# Patient Record
Sex: Female | Born: 1935 | Hispanic: No
Health system: Northeastern US, Academic
[De-identification: ages and names within clinical notes are randomized; demographics above are authoritative.]

## PROBLEM LIST (undated history)

## (undated) DIAGNOSIS — T7840XA Allergy, unspecified, initial encounter: Secondary | ICD-10-CM

## (undated) DIAGNOSIS — E785 Hyperlipidemia, unspecified: Secondary | ICD-10-CM

## (undated) DIAGNOSIS — I2510 Atherosclerotic heart disease of native coronary artery without angina pectoris: Secondary | ICD-10-CM

## (undated) DIAGNOSIS — I471 Supraventricular tachycardia, unspecified: Secondary | ICD-10-CM

## (undated) DIAGNOSIS — D303 Benign neoplasm of bladder: Secondary | ICD-10-CM

## (undated) DIAGNOSIS — M48061 Spinal stenosis, lumbar region without neurogenic claudication: Secondary | ICD-10-CM

## (undated) DIAGNOSIS — M1990 Unspecified osteoarthritis, unspecified site: Secondary | ICD-10-CM

## (undated) DIAGNOSIS — M810 Age-related osteoporosis without current pathological fracture: Secondary | ICD-10-CM

## (undated) DIAGNOSIS — I10 Essential (primary) hypertension: Secondary | ICD-10-CM

## (undated) DIAGNOSIS — K219 Gastro-esophageal reflux disease without esophagitis: Secondary | ICD-10-CM

## (undated) DIAGNOSIS — Z8619 Personal history of other infectious and parasitic diseases: Secondary | ICD-10-CM

## (undated) DIAGNOSIS — K5792 Diverticulitis of intestine, part unspecified, without perforation or abscess without bleeding: Secondary | ICD-10-CM

## (undated) HISTORY — DX: Diverticulitis of intestine, part unspecified, without perforation or abscess without bleeding: K57.92

## (undated) HISTORY — DX: Personal history of other infectious and parasitic diseases: Z86.19

## (undated) HISTORY — DX: Unspecified osteoarthritis, unspecified site: M19.90

## (undated) HISTORY — DX: Essential (primary) hypertension: I10

## (undated) HISTORY — DX: Spinal stenosis, lumbar region without neurogenic claudication: M48.061

## (undated) HISTORY — PX: CYSTECTOMY: SUR359

## (undated) HISTORY — DX: Supraventricular tachycardia, unspecified: I47.10

## (undated) HISTORY — DX: Supraventricular tachycardia: I47.1

## (undated) HISTORY — DX: Benign neoplasm of bladder: D30.3

## (undated) HISTORY — DX: Gastro-esophageal reflux disease without esophagitis: K21.9

## (undated) HISTORY — DX: Atherosclerotic heart disease of native coronary artery without angina pectoris: I25.10

## (undated) HISTORY — PX: LIPOMA EXCISION: SHX5283

## (undated) HISTORY — DX: Age-related osteoporosis without current pathological fracture: M81.0

## (undated) HISTORY — PX: PELVIC LAPAROSCOPY: SHX162

## (undated) HISTORY — DX: Allergy, unspecified, initial encounter: T78.40XA

## (undated) HISTORY — DX: Hyperlipidemia, unspecified: E78.5

---

## 1998-10-12 ENCOUNTER — Emergency Department (HOSPITAL_COMMUNITY): Admission: EM | Admit: 1998-10-12 | Discharge: 1998-10-12 | Payer: Self-pay | Admitting: Emergency Medicine

## 1998-10-13 ENCOUNTER — Emergency Department (HOSPITAL_COMMUNITY): Admission: EM | Admit: 1998-10-13 | Discharge: 1998-10-13 | Payer: Self-pay | Admitting: Emergency Medicine

## 1998-10-13 ENCOUNTER — Encounter: Payer: Self-pay | Admitting: Emergency Medicine

## 2002-11-24 ENCOUNTER — Encounter: Admission: RE | Admit: 2002-11-24 | Discharge: 2002-11-24 | Payer: Self-pay | Admitting: Obstetrics and Gynecology

## 2002-11-24 ENCOUNTER — Encounter: Payer: Self-pay | Admitting: Obstetrics and Gynecology

## 2002-12-17 ENCOUNTER — Encounter: Admission: RE | Admit: 2002-12-17 | Discharge: 2002-12-17 | Payer: Self-pay

## 2003-10-14 DIAGNOSIS — D303 Benign neoplasm of bladder: Secondary | ICD-10-CM

## 2003-10-14 HISTORY — DX: Benign neoplasm of bladder: D30.3

## 2004-12-06 ENCOUNTER — Encounter: Admission: RE | Admit: 2004-12-06 | Discharge: 2004-12-06 | Payer: Self-pay | Admitting: Obstetrics and Gynecology

## 2004-12-11 ENCOUNTER — Encounter: Admission: RE | Admit: 2004-12-11 | Discharge: 2004-12-11 | Payer: Self-pay | Admitting: Cardiology

## 2009-09-15 ENCOUNTER — Encounter: Admission: RE | Admit: 2009-09-15 | Discharge: 2009-09-15 | Payer: Self-pay | Admitting: Internal Medicine

## 2010-11-03 ENCOUNTER — Encounter: Payer: Self-pay | Admitting: Obstetrics and Gynecology

## 2011-08-05 ENCOUNTER — Ambulatory Visit: Payer: Self-pay | Admitting: Internal Medicine

## 2011-08-12 ENCOUNTER — Encounter: Payer: Self-pay | Admitting: Family Medicine

## 2011-08-19 ENCOUNTER — Encounter: Payer: Self-pay | Admitting: Family Medicine

## 2011-10-06 ENCOUNTER — Encounter: Payer: Self-pay | Admitting: Family Medicine

## 2011-12-01 ENCOUNTER — Encounter: Payer: Self-pay | Admitting: Family Medicine

## 2012-09-06 ENCOUNTER — Ambulatory Visit: Payer: Self-pay | Admitting: Gynecology

## 2012-09-13 ENCOUNTER — Ambulatory Visit (INDEPENDENT_AMBULATORY_CARE_PROVIDER_SITE_OTHER): Payer: Medicare Other | Admitting: Internal Medicine

## 2012-09-13 ENCOUNTER — Encounter: Payer: Self-pay | Admitting: Internal Medicine

## 2012-09-13 VITALS — BP 118/80 | HR 76 | Temp 98.0°F | Ht 61.0 in | Wt 136.0 lb

## 2012-09-13 DIAGNOSIS — M79606 Pain in leg, unspecified: Secondary | ICD-10-CM | POA: Insufficient documentation

## 2012-09-13 DIAGNOSIS — M810 Age-related osteoporosis without current pathological fracture: Secondary | ICD-10-CM | POA: Insufficient documentation

## 2012-09-13 DIAGNOSIS — I2510 Atherosclerotic heart disease of native coronary artery without angina pectoris: Secondary | ICD-10-CM

## 2012-09-13 DIAGNOSIS — K219 Gastro-esophageal reflux disease without esophagitis: Secondary | ICD-10-CM | POA: Insufficient documentation

## 2012-09-13 DIAGNOSIS — G629 Polyneuropathy, unspecified: Secondary | ICD-10-CM

## 2012-09-13 DIAGNOSIS — R7309 Other abnormal glucose: Secondary | ICD-10-CM

## 2012-09-13 DIAGNOSIS — R531 Weakness: Secondary | ICD-10-CM

## 2012-09-13 DIAGNOSIS — M79609 Pain in unspecified limb: Secondary | ICD-10-CM

## 2012-09-13 DIAGNOSIS — R5381 Other malaise: Secondary | ICD-10-CM

## 2012-09-13 DIAGNOSIS — M48061 Spinal stenosis, lumbar region without neurogenic claudication: Secondary | ICD-10-CM

## 2012-09-13 DIAGNOSIS — E785 Hyperlipidemia, unspecified: Secondary | ICD-10-CM

## 2012-09-13 DIAGNOSIS — M791 Myalgia, unspecified site: Secondary | ICD-10-CM

## 2012-09-13 DIAGNOSIS — G609 Hereditary and idiopathic neuropathy, unspecified: Secondary | ICD-10-CM

## 2012-09-13 DIAGNOSIS — IMO0001 Reserved for inherently not codable concepts without codable children: Secondary | ICD-10-CM

## 2012-09-13 LAB — VITAMIN B12: Vitamin B-12: >1500 pg/mL — ABNORMAL HIGH (ref 211–911)

## 2012-09-13 LAB — CBC WITH DIFFERENTIAL/PLATELET
Basophils Relative: 0.3 % (ref 0.0–3.0)
Eosinophils Relative: 2.1 % (ref 0.0–5.0)
HCT: 42.2 % (ref 36.0–46.0)
Hemoglobin: 13.9 g/dL (ref 12.0–15.0)
Lymphs Abs: 1.8 10*3/uL (ref 0.7–4.0)
MCV: 89.7 fl (ref 78.0–100.0)
Monocytes Relative: 9.1 % (ref 3.0–12.0)
Neutro Abs: 4.8 10*3/uL (ref 1.4–7.7)
WBC: 7.5 10*3/uL (ref 4.5–10.5)

## 2012-09-13 LAB — BASIC METABOLIC PANEL
Chloride: 102 mEq/L (ref 96–112)
GFR: 59.45 mL/min — ABNORMAL LOW (ref >60.00)
Potassium: 3.6 mEq/L (ref 3.5–5.1)
Sodium: 137 mEq/L (ref 135–145)

## 2012-09-13 LAB — HEPATIC FUNCTION PANEL
ALT: 25 U/L (ref 0–35)
AST: 27 U/L (ref 0–37)
Albumin: 4 g/dL (ref 3.5–5.2)
Total Bilirubin: 0.6 mg/dL (ref 0.3–1.2)
Total Protein: 7.1 g/dL (ref 6.0–8.3)

## 2012-09-13 LAB — LIPID PANEL
Cholesterol: 191 mg/dL (ref 0–200)
LDL Cholesterol: 96 mg/dL (ref 0–99)

## 2012-09-13 NOTE — Progress Notes (Signed)
Subjective:    Patient ID: Ashley Walter, female    DOB: 1926-08-11, 78 y.o.   MRN: [id number]  HPI  78 year old white female to establish. Patient originally from [place]. Her daughter lives in [place]. She spends her summers in [place] and winters in [place]. Patient reports history of coronary artery disease. She had her initial myocardial infarction in 1992. Patient reports her heart attack was mild. She completed cardiac catheterization. She did not have any surgery or stents placed.  Patient also has history of severe osteoporosis. She last had her bone density scan 2 months ago which showed stable bone mineral density. She was previously on bisphosphonates but discontinued secondary to gastrointestinal upset. Patient is unwilling to try IV ReClast.  She reports being seen at [facility] in [place] for "spot" on her left kidney. This is being monitored periodically.  She also reports having benign cyst in her bladder removed 7 years ago.  She was seen by local neurologist-Dr. Love for neuropathic symptoms in 2002. Patient unclear whether her lower extremity symptoms secondary to spinal stenosis versus peripheral neuropathy.  Her main complaint today is difficulty ambulating do to overall weakness and lack of stability. Patient has history of significant lumbar stenosis. Her last MRI of lumbar spine was completed in December, 2010. It shows severe spinal stenosis at L4-L5. She is also to have advanced facet arthropathy along 6 millimeters of anterolisthesis. There is also a synovial cyst arising from the facet joint on the left.   Patient reports she has not considered surgery due to her advanced age. She has been seeing a Land in [place] with some relief. She is interested in continuing spinal manipulation therapy.   Review of Systems  Constitutional: Negative for activity change, appetite change and unexpected weight change.  Eyes: Negative for visual  disturbance.  Respiratory: Negative for cough, chest tightness and shortness of breath.   Cardiovascular: Negative for chest pain.  Genitourinary: Negative for difficulty urinating.  Neurological: Negative for headaches.  Gastrointestinal: Negative for abdominal pain, heartburn melena or hematochezia GU:  She has intermittent uterine prolapse Psych:  Negative for depression or anxiety  Past Medical History  Diagnosis Date  . Hyperlipidemia   . Arthritis   . Allergy   . GERD (gastroesophageal reflux disease)   . Diverticulitis   . Heart disease   . History of chickenpox   . Spinal stenosis, lumbar   . Osteoporosis   . Benign bladder tumor 2005    History   Social History  . Marital Status: Widowed    Spouse Name: N/A    Number of Children: N/A  . Years of Education: N/A   Occupational History  . Not on file.   Social History Main Topics  . Smoking status: Never Smoker   . Smokeless tobacco: Not on file  . Alcohol Use: No  . Drug Use: No  . Sexually Active: Not on file   Other Topics Concern  . 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 Lipoma excision     benign, neck  . Cystectomy     bladder    Family History  Problem Relation Age of Onset  . Arthritis    . Hyperlipidemia Father   . Heart disease Father   . Hypertension Father   . Uterine cancer Sister     Allergies  Allergen Reactions  . Codeine   . Contrast  Media (Iodinated Diagnostic Agents)   . Effexor (Venlafaxine Hcl)   . Latex   . Penicillins     Current Outpatient Prescriptions on File Prior to Visit  Medication Sig Dispense Refill  . atorvastatin (LIPITOR) 20 MG tablet Take 10 mg by mouth daily.      . Calcium Carbonate 1500 MG TABS Take 1 tablet by mouth daily.      Marland Kitchen diltiazem (CARDIZEM CD) 240 MG 24 hr capsule Take 240 mg by mouth daily.      Marland Kitchen venlafaxine (EFFEXOR) 37.5 MG tablet Take 1 tablet (37.5  mg total) by mouth 2 (two) times daily.        BP 118/80  Pulse 76  Temp 98 F (36.7 C) (Oral)  Ht 5\' 1"  (1.549 m)  Wt 136 lb (61.689 kg)  BMI 25.70 kg/m2         Objective:   Physical Exam  Constitutional: She is oriented to person, place, and time. She appears well-developed and well-nourished.  HENT:  Head: Normocephalic and atraumatic.  Right Ear: External ear normal.  Left Ear: External ear normal.  Mouth/Throat: Oropharynx is clear and moist.  Eyes: EOM are normal. Pupils are equal, round, and reactive to light. No scleral icterus.  Neck: Neck supple.       No carotid bruit  Cardiovascular: Normal rate, regular rhythm, normal heart sounds and intact distal pulses.   Pulmonary/Chest: Effort normal and breath sounds normal. She has no wheezes.  Abdominal: Soft. She exhibits no distension and no mass. There is no rebound.       Mild diffuse tenderness  Musculoskeletal: She exhibits no edema.  Lymphadenopathy:    She has no cervical adenopathy.  Neurological: She is alert and oriented to person, place, and time.       Muscle strength upper extremities 45, lower extremity also 4/5 Brachial reflexes normal, patellar reflex normal, diminished Achilles reflex bilaterally Patient ambulates with single prong cane, unsteady gait Decreased sensation to vibration of lower extremities  Skin: Skin is warm and dry.  Psychiatric: She has a normal mood and affect. Her behavior is normal.          Assessment & Plan:

## 2012-09-13 NOTE — Assessment & Plan Note (Signed)
Her cardiologists this in [place]. She is maintained on Lipitor.

## 2012-09-13 NOTE — Assessment & Plan Note (Signed)
Patient has history of chronic lower extremity pain. She was told she may have peripheral neuropathy. She has relatively preserved intact sensation to vibration in her feet. I doubt she has peripheral neuropathy. Check thyroid function, B12, hemoglobin A1c. I suspect her lower remedy symptoms are secondary to her severe lumbar stenosis. Continue tramadol for now.

## 2012-09-13 NOTE — Assessment & Plan Note (Signed)
Patient has intolerance to oral bisphosphonates. We discussed possibly using Prolia. She would like to research medication before agreeing to start treatment.

## 2012-09-13 NOTE — Assessment & Plan Note (Signed)
Patient reports progressive worsening of lower extremity weakness over the last 6 months. She also feels weaker in general. Obtain sedimentation rate to rule out polymyalgia rheumatica. I suspect her symptoms are secondary to chronic lumbar stenosis. She is not a surgical candidate due to her advanced age. We discussed using myofascial techniques for symptomatic relief.

## 2012-09-23 ENCOUNTER — Encounter: Payer: Self-pay | Admitting: Gynecology

## 2012-09-23 ENCOUNTER — Ambulatory Visit: Payer: Medicare Other | Admitting: Internal Medicine

## 2012-09-24 ENCOUNTER — Ambulatory Visit: Payer: Medicare Other | Admitting: Internal Medicine

## 2012-09-28 ENCOUNTER — Telehealth: Payer: Self-pay | Admitting: Internal Medicine

## 2012-09-28 NOTE — Telephone Encounter (Signed)
Pt would like to know if you can do the manipulation on her back/left side at her Friday scheduled appt.? She is in distress/pain. Pt phone [phone]

## 2012-09-28 NOTE — Telephone Encounter (Signed)
No openings on Friday.  She can come tomorrow at 3:45 or 4

## 2012-09-29 NOTE — Telephone Encounter (Signed)
She has appt on Fri. Pt was wanting to know if MD can do the manipulation on her back at that visit. If not, she is not going to come in for appt.

## 2012-09-29 NOTE — Telephone Encounter (Signed)
Pt aware/kjh 

## 2012-09-29 NOTE — Telephone Encounter (Signed)
Oh yes he can do it at her appt no problem

## 2012-10-01 ENCOUNTER — Ambulatory Visit (INDEPENDENT_AMBULATORY_CARE_PROVIDER_SITE_OTHER): Payer: Medicare Other | Admitting: Internal Medicine

## 2012-10-01 ENCOUNTER — Encounter: Payer: Self-pay | Admitting: Internal Medicine

## 2012-10-01 VITALS — BP 104/68 | Temp 98.3°F | Wt 136.0 lb

## 2012-10-01 DIAGNOSIS — M48061 Spinal stenosis, lumbar region without neurogenic claudication: Secondary | ICD-10-CM

## 2012-10-01 NOTE — Patient Instructions (Signed)
I suggest you follow up with physical therapist re: gait instability.  Perform hip girdle, hip flexor and quadricep strengthening exercises.  Also consider starting aqua therapy

## 2012-10-01 NOTE — Assessment & Plan Note (Signed)
Patient has chronic lumbar stenosis. She is not surgical candidate due to her advanced age.  Utilized soft tissue myofascial release techniques for symptomatic relief. Also used muscle energy techniques to correct sacrolumbar somatic dysfunction.  Patient has followup with her orthopedic specialist. I suggest she restart physical therapy to strengthen her lower extremity muscles.  Follow up in 1 month.  Sed rate normal.

## 2012-10-01 NOTE — Progress Notes (Signed)
Subjective:    Patient ID: Ashley Walter, female    DOB: Feb 26, 1926, 78 y.o.   MRN: [id number]  HPI  78 year old white female with history of chronic low back pain and severe lumbar spinal stenosis for follow up. Patient reports she "overdid it" over the past one week cleaning her house and complains of pain in her back that radiates to both feet. She has more discomfort into her left foot.  She also has "catch" in her left low back.  She mentions that her gait has been somewhat unsteady. She has lost muscle mass in her lower extremities.  She feels weak in her legs.  She used to exercise more in the past.  She used to see a Land when she was living in [place]. She has had some relief with massage and spinal manipulation in the past.   Review of Systems No bowel or bladder incontinence.   Past Medical History  Diagnosis Date  . Hyperlipidemia   . Arthritis   . Allergy   . GERD (gastroesophageal reflux disease)   . Diverticulitis   . Heart disease   . History of chickenpox   . Spinal stenosis, lumbar   . Benign bladder tumor 2005  . Osteoporosis     History   Social History  . Marital Status: Widowed    Spouse Name: N/A    Number of Children: N/A  . Years of Education: N/A   Occupational History  . Not on file.   Social History Main Topics  . Smoking status: Never Smoker   . Smokeless tobacco: Not on file  . Alcohol Use: No  . Drug Use: No  . Sexually Active: Yes    Birth Control/ Protection: Post-menopausal   Other Topics Concern  . 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 Lipoma excision     benign, neck  . Cystectomy     bladder  . Pelvic laparoscopy     Family History  Problem Relation Age of Onset  . Arthritis    . Hyperlipidemia Father   . Heart disease Father   . Hypertension Father   . Uterine cancer Sister     Allergies  Allergen Reactions  .  Codeine   . Contrast Media (Iodinated Diagnostic Agents)   . Crab (Shellfish Allergy)   . Effexor (Venlafaxine Hcl)   . Latex   . Penicillins     Current Outpatient Prescriptions on File Prior to Visit  Medication Sig Dispense Refill  . aspirin 81 MG tablet Take 81 mg by mouth daily.      Marland Kitchen atorvastatin (LIPITOR) 20 MG tablet Take 10 mg by mouth daily.      . Calcium Carbonate 1500 MG TABS Take 1 tablet by mouth daily.      . cholecalciferol (VITAMIN D) 1000 UNITS tablet Take 1,000 Units by mouth daily.      Marland Kitchen diltiazem (CARDIZEM CD) 240 MG 24 hr capsule Take 240 mg by mouth daily.      . traMADol (ULTRAM) 50 MG tablet Take 50 mg by mouth every 6 (six) hours as needed.      . venlafaxine (EFFEXOR) 37.5 MG tablet Take 1 tablet (37.5 mg total) by mouth 2 (two) times daily.        BP 104/68  Temp 98.3 F (36.8 C) (Oral)  Wt 136 lb (61.689 kg)  Objective:   Physical Exam  Constitutional: She is oriented to person, place, and time. She appears well-developed and well-nourished.  Cardiovascular: Normal rate, regular rhythm and normal heart sounds.   Pulmonary/Chest: Effort normal and breath sounds normal. She has no wheezes.  Musculoskeletal: She exhibits no edema.       Atrophy of bilateral quadriceps, weak hip flexors  Neurological: She is alert and oriented to person, place, and time. No cranial nerve deficit.       Mild unsteady gait          Assessment & Plan:

## 2012-10-05 ENCOUNTER — Ambulatory Visit (INDEPENDENT_AMBULATORY_CARE_PROVIDER_SITE_OTHER): Payer: Medicare Other | Admitting: Obstetrics and Gynecology

## 2012-10-05 ENCOUNTER — Ambulatory Visit: Payer: Self-pay | Admitting: Obstetrics and Gynecology

## 2012-10-05 ENCOUNTER — Encounter: Payer: Self-pay | Admitting: Obstetrics and Gynecology

## 2012-10-05 ENCOUNTER — Ambulatory Visit: Payer: Self-pay | Admitting: Gynecology

## 2012-10-05 ENCOUNTER — Other Ambulatory Visit: Payer: Self-pay | Admitting: Obstetrics and Gynecology

## 2012-10-05 VITALS — BP 120/70 | Ht 61.0 in | Wt 124.0 lb

## 2012-10-05 DIAGNOSIS — N898 Other specified noninflammatory disorders of vagina: Secondary | ICD-10-CM

## 2012-10-05 DIAGNOSIS — N393 Stress incontinence (female) (male): Secondary | ICD-10-CM

## 2012-10-05 DIAGNOSIS — N899 Noninflammatory disorder of vagina, unspecified: Secondary | ICD-10-CM

## 2012-10-05 DIAGNOSIS — B373 Candidiasis of vulva and vagina: Secondary | ICD-10-CM

## 2012-10-05 DIAGNOSIS — B3731 Acute candidiasis of vulva and vagina: Secondary | ICD-10-CM

## 2012-10-05 DIAGNOSIS — N814 Uterovaginal prolapse, unspecified: Secondary | ICD-10-CM

## 2012-10-05 DIAGNOSIS — N952 Postmenopausal atrophic vaginitis: Secondary | ICD-10-CM

## 2012-10-05 LAB — WET PREP FOR TRICH, YEAST, CLUE
Clue Cells Wet Prep HPF POC: NONE SEEN
Trich, Wet Prep: NONE SEEN

## 2012-10-05 MED ORDER — TERCONAZOLE 0.8 % VA CREA: TOPICAL_CREAM | VAGINAL | Status: DC

## 2012-10-05 MED ORDER — ESTRADIOL 0.1 MG/GM VA CREA: TOPICAL_CREAM | VAGINAL | Status: DC

## 2012-10-05 NOTE — Progress Notes (Signed)
Patient is an 78 year old gravida 4 para 4 AB 0 who came to see me today as a new patient to discuss many gynecological problems. She used to be a patient of our office but had not been to see [place] since 2006 and therefore this was considered a new patient visit. She lives in [place] in the summer but in  [place] in the winter. Her biggest issue today was vulvar and vaginal burning. She has been previously diagnosed both with vulvadynia  and with yeast vaginitis but continues to get reoccurrences of the burning. She has  Seen a gynecologist in [place] and has seen [place] for this problem greater than 7 years ago. When she saw [place]  she did well with terconazole 3 cream but has had reoccurrences. She said she's been checked recently for diabetes and did not have it. She is having no vaginal bleeding and no pelvic pain. She is aware of something dropping and wondered if was her uterus. She is also noticing loss of urine with coughing laughing and sneezing. She is not sexually active. She is not  having vaginal bleeding. She is not having pelvic pain. She is aware of vaginal dryness. In reviewing her old records she use Vagifem many years ago with good results. She has had a mammogram and Pap smear this year in [place]. She has been diagnosed with osteoporosis and now sees Dr. Artist Pais here in  [place]. She stopped Fosamax because of GERD and he is discussed IV Reclast with her which she declined. She was once fit with her pessary for previous uterine prolapse but felt was too tight and had it removed.  ROS: Reviewed. Reviewed Dr. Olegario Messier office notes as well in terms of review of systems.  Exam: Kennon Portela present. Abdomen is soft without guarding rebound or masses. She collects black material in her umbilicus at the site of her laparoscopy which she removes but reoccurs. External genitalia: No lesions present, somewhat vulvitis. BUS: Within normal limits. Vaginal exam: Significant atrophic changes. No obvious  cystocele or rectocele. Cervix: Clean. Uterus: Retroverted normal size and shape with second-degree uterine descensus. Adnexa: No masses. Rectovaginal exam: Within normal limits. Wet prep negative.  Assessment: #1. Vulvar and vaginal burning mostly due to recurrent yeast infection. #2. Uterine prolapse #3. Osteoporosis #4. Atrophic vaginitis  Plan: We treated her  for yeast vaginitis in spite of negative wet prep. She was given a prescription for terconazole 3 cream. She will use it externally and internally. She was given refills and told that probably would only occur from time to time. She will also initiate Estrace cream for atrophic vaginitis. She is not interested in surgery for her uterine prolapse but I told her I thought we could fit her for a comfortable pessary. She will return to see Dr. Lily Peer in 8 weeks for pessary fitting  when she is better estrogenized.

## 2012-10-05 NOTE — Patient Instructions (Signed)
Use  terconazole 3 cream. Use Estrace cream. Return to see Dr. Brayton Layman in 8 weeks to be fitted for a pessary.

## 2012-10-06 LAB — HSV(HERPES SIMPLEX VRS) I + II AB-IGG
HSV 1 Glycoprotein G Ab, IgG: 11.53 IV — ABNORMAL HIGH
HSV 2 Glycoprotein G Ab, IgG: 8.65 IV — ABNORMAL HIGH

## 2012-10-08 ENCOUNTER — Telehealth: Payer: Self-pay | Admitting: Internal Medicine

## 2012-10-08 NOTE — Telephone Encounter (Signed)
Patient's daughter called stating that she need a referral for home health as her mom has injured her left knee. Patient is seeing [facility] today for this. Please advise.

## 2012-10-08 NOTE — Telephone Encounter (Signed)
Called daughter and told daughter that [facility] could refer her home health if the pt needed it.  Confirmed this with Dr Artist Pais.  She will ask them today.  Her appt is at 4

## 2012-10-08 NOTE — Telephone Encounter (Signed)
Patient's daughter can be contacted at [phone].

## 2012-10-12 ENCOUNTER — Telehealth: Payer: Self-pay | Admitting: *Deleted

## 2012-10-12 MED ORDER — TERCONAZOLE 0.8 % VA CREA: TOPICAL_CREAM | VAGINAL | Status: DC

## 2012-10-12 NOTE — Telephone Encounter (Signed)
Pt was seen on 10/05/12 given Terazol, pt called today and asked if she could have Rx for Terazol with refill to take back home with her in [place]? If okay I will print Rx and have you sign, pt requested Rx be sent via mail. Please advise

## 2012-10-12 NOTE — Telephone Encounter (Signed)
rx printed and will be mailed to pt address

## 2012-10-12 NOTE — Addendum Note (Signed)
Addended by: Aura Camps on: 10/12/2012 03:57 PM   Modules accepted: Orders

## 2012-10-12 NOTE — Telephone Encounter (Signed)
Okay 

## 2012-10-29 ENCOUNTER — Ambulatory Visit: Payer: Medicare Other | Admitting: Internal Medicine

## 2012-11-02 ENCOUNTER — Ambulatory Visit: Payer: Medicare Other | Admitting: Cardiology

## 2012-11-19 ENCOUNTER — Ambulatory Visit (INDEPENDENT_AMBULATORY_CARE_PROVIDER_SITE_OTHER): Payer: Medicare Other | Admitting: Cardiology

## 2012-11-19 ENCOUNTER — Encounter: Payer: Self-pay | Admitting: Cardiology

## 2012-11-19 VITALS — BP 135/82 | HR 87 | Ht 61.0 in | Wt 133.2 lb

## 2012-11-19 DIAGNOSIS — E785 Hyperlipidemia, unspecified: Secondary | ICD-10-CM

## 2012-11-19 DIAGNOSIS — I2582 Chronic total occlusion of coronary artery: Secondary | ICD-10-CM

## 2012-11-19 MED ORDER — DILTIAZEM HCL 60 MG PO TABS: 60.0000 mg | ORAL_TABLET | Freq: Three times a day (TID) | ORAL | Status: DC

## 2012-11-19 NOTE — Progress Notes (Signed)
HPI The patient presents as a new patient. She previously lived in [place]. She does have a history of coronary disease with a reported occlusion of her circumflex artery with thrombus in 1992. She was apparently managed medically. She has had recurrent supraventricular tachycardia with palpitations medically. I was able to review helpful outside records. This included catheterization in April of 1992. She did have some atypical chest pain last year. She had a stress perfusion study with a small fixed apical defect. Ventricular function was normal at 58%. Echocardiography in 2011 demonstrated a well preserved ejection fraction, moderate concentric left ventricular hypertrophy mild mitral valve regurgitation, tricuspid valve regurgitation and aortic insufficiency.  She also reports that she has been told she had left bundle branch block in the past although the last EKG I see was narrow complex and 2013.  She has multiple somatic complaints. But in particular she complains of anxiety and a high stress level. She feels lightheaded. She feels dizzy. She doesn't describe presyncope or syncope. She is somewhat limited by knee and back pain. She walks with a cane. She does describe some chest discomfort but this is apparently not different than pain she has had at the time of her last stress test. She doesn't take nitroglycerin. She doesn't have any new shortness of breath, PND or orthopnea. She's had no weight gain or edema. She says when she does get chest discomfort she drinks water. She says it feels like she has difficulty swallowing. It is not like the discomfort she had in 1992 which was left arm discomfort. She does describe fatigue   Allergies  Allergen Reactions  . Contrast Media (Iodinated Diagnostic Agents) Hives  . Penicillins Hives  . Codeine   . Crab (Shellfish Allergy)   . Effexor (Venlafaxine Hcl)   . Latex     Current Outpatient Prescriptions  Medication Sig Dispense Refill  .  aspirin 81 MG tablet Take 81 mg by mouth daily.      . Calcium Carbonate 1500 MG TABS Take 1 tablet by mouth daily.      . cholecalciferol (VITAMIN D) 1000 UNITS tablet Take 1,000 Units by mouth daily.      Marland Kitchen diltiazem (CARDIZEM) 60 MG tablet Take 60 mg by mouth 2 (two) times daily.      Marland Kitchen estradiol (ESTRACE VAGINAL) 0.1 MG/GM vaginal cream Use 1 g in vagina 3 times a week.  42.5 g  6  . traMADol (ULTRAM) 50 MG tablet Take 50 mg by mouth every 6 (six) hours as needed.        Past Medical History  Diagnosis Date  . Hyperlipidemia   . Arthritis   . Allergy   . GERD (gastroesophageal reflux disease)   . Diverticulitis   . CAD (coronary artery disease)     Occlusion of the circumflex with clot 1992 in [place].  Managed medically.   Marland Kitchen History of chickenpox   . Spinal stenosis, lumbar   . Benign bladder tumor 2005  . Osteoporosis   . SVT (supraventricular tachycardia)     Past Surgical History  Procedure Date  . Lipoma excision     benign, neck  . Cystectomy     bladder  . Pelvic laparoscopy     Family History  Problem Relation Age of Onset  . Hyperlipidemia Father   . Heart disease Father   . Hypertension Father   . Uterine cancer Sister     History   Social History  . Marital Status: Widowed  Spouse Name: N/A    Number of Children: N/A  . Years of Education: N/A   Occupational History  . Not on file.   Social History Main Topics  . Smoking status: Never Smoker   . Smokeless tobacco: Not on file  . Alcohol Use: No     Comment: Rare  . Drug Use: No  . Sexually Active: No   Other Topics Concern  . Not on file   Social History Narrative   Retired Physiological scientist.     4 children    ROS:  Sinus rhythm, lightheadedness, dizziness, constipation, reflux, difficulty swallowing, urinary frequency, joint pains and swelling, spinal stenosis and chronic back pain.   Otherwise as stated in the HPI and negative for all other systems.  PHYSICAL EXAM BP 135/82  Pulse 87   Ht 5\' 1"  (1.549 m)  Wt 133 lb 3.2 oz (60.419 kg)  BMI 25.17 kg/m2 GENERAL:  Well appearing HEENT:  Pupils equal round and reactive, fundi not visualized, oral mucosa unremarkable NECK:  No jugular venous distention, waveform within normal limits, carotid upstroke brisk and symmetric, no bruits, no thyromegaly LYMPHATICS:  No cervical, inguinal adenopathy LUNGS:  Clear to auscultation bilaterally BACK:  No CVA tenderness CHEST:  Unremarkable HEART:  PMI not displaced or sustained,S1 and S2 within normal limits, no S3, no S4, no clicks, no rubs, no murmurs ABD:  Flat, positive bowel sounds normal in frequency in pitch, no bruits, no rebound, no guarding, no midline pulsatile mass, no hepatomegaly, no splenomegaly EXT:  2 plus pulses throughout, no edema, no cyanosis no clubbing SKIN:  No rashes no nodules NEURO:  Cranial nerves II through XII grossly intact, motor grossly intact throughout PSYCH:  Cognitively intact, oriented to person place and time   EKG: Sinus rhythm, left bundle branch block, QTC prolonged. 11/19/2012   ASSESSMENT AND PLAN  CAD - The patient has some atypical chest pain. I do not think however that this is different than she might of had at the time of her last stress test. I do not suspect angina and no further cardiovascular testing is planned.  ANXIETY - I think this is a major component to current complaints. I sent a note to Dr. Artist Pais asking him if he would address this.  PALPITATIONS - I think she should be on 3 times a day Cardizem and I have increased this dose.  DYSLIPIDEMIA - We discussed this at length. I will check a lipid profile. She was taking a low dose of Lipitor and I have encouraged that she at least continue with this.

## 2012-11-19 NOTE — Patient Instructions (Addendum)
Please increase your Cardizem to 60 mg three times a day. Continue all other medications as listed.  Please have fasting lipid profile at your primary care MD office. (the order for the blood work is in the computer system)  You should call their office to schedule an appointment time for this lab work.  Please do not eat or drink after 12N the night before.  Follow up with Dr. Antoine Poche in 2 months

## 2012-11-22 ENCOUNTER — Telehealth: Payer: Self-pay | Admitting: *Deleted

## 2012-11-22 NOTE — Telephone Encounter (Signed)
appt scheduled for 11/26/12 at 1:15

## 2012-11-22 NOTE — Telephone Encounter (Signed)
Message copied by Jacqualyn Posey on Mon Nov 22, 2012 10:40 AM ------      Message from: Meda Coffee      Created: Fri Nov 19, 2012  5:52 PM       Please call pt and arrange OV to discuss anxiety issues as suggested by Dr. Antoine Poche      ----- Message -----         From: Rollene Rotunda, MD         Sent: 11/19/2012   5:39 PM           To: Meda Coffee, DO            Hey,  How are you.  I saw this patient.  You probably know that she is Drenda Freeze Aluisio's aunt.  She is a bundle of nerves.  She would like to see you soon because she needs something for this.  Thanks.         ------

## 2012-11-24 ENCOUNTER — Encounter: Payer: Self-pay | Admitting: Internal Medicine

## 2012-11-24 ENCOUNTER — Ambulatory Visit (INDEPENDENT_AMBULATORY_CARE_PROVIDER_SITE_OTHER): Payer: Medicare Other | Admitting: Internal Medicine

## 2012-11-24 VITALS — BP 122/80 | Temp 97.9°F | Wt 132.0 lb

## 2012-11-24 DIAGNOSIS — F411 Generalized anxiety disorder: Secondary | ICD-10-CM

## 2012-11-24 DIAGNOSIS — E785 Hyperlipidemia, unspecified: Secondary | ICD-10-CM

## 2012-11-24 DIAGNOSIS — N390 Urinary tract infection, site not specified: Secondary | ICD-10-CM | POA: Insufficient documentation

## 2012-11-24 DIAGNOSIS — R300 Dysuria: Secondary | ICD-10-CM

## 2012-11-24 MED ORDER — ALPRAZOLAM 0.25 MG PO TABS: 0.1250 mg | ORAL_TABLET | Freq: Every day | ORAL | Status: AC | PRN

## 2012-11-24 MED ORDER — ROSUVASTATIN CALCIUM 5 MG PO TABS: 5.0000 mg | ORAL_TABLET | Freq: Every day | ORAL | Status: DC

## 2012-11-24 MED ORDER — CEFUROXIME AXETIL 250 MG PO TABS: 250.0000 mg | ORAL_TABLET | Freq: Two times a day (BID) | ORAL | Status: DC

## 2012-11-24 MED ORDER — CELEXA 20 MG PO TABS: ORAL_TABLET | ORAL | Status: DC

## 2012-11-24 NOTE — Progress Notes (Signed)
Subjective:    Patient ID: Ashley Walter, female    DOB: 1926/03/23, 79 y.o.   MRN: [id number]  HPI  79 year old white female with history of coronary artery disease, chronic low back pain for followup. Since previous visit patient was seen by cardiologist. He suggested patient start on statin therapy. Patient has not started Lipitor. She would like to try Crestor instead.  Patient having multiple somatic complaints as noted by Dr. Antoine Poche. Cardiologist felt most of her symptoms related to stress disorder. Patient admits to having chronic anxiety most of her life. She has tried Lexapro in the past but discontinued. She denies any specific side effects.  Patient also complains of intermittent lower abdominal pain for last 4-5 days. She is worried that she may have flare of diverticulitis. She has suprapubic discomfort.  Review of Systems Negative for fever or chills She has chronic constipation  Past Medical History  Diagnosis Date  . Hyperlipidemia   . Arthritis   . Allergy   . GERD (gastroesophageal reflux disease)   . Diverticulitis   . CAD (coronary artery disease)     Occlusion of the circumflex with clot 1992 in [place].  Managed medically.   Marland Kitchen History of chickenpox   . Spinal stenosis, lumbar   . Benign bladder tumor 2005  . Osteoporosis   . SVT (supraventricular tachycardia)     History   Social History  . Marital Status: Widowed    Spouse Name: N/A    Number of Children: N/A  . Years of Education: N/A   Occupational History  . Not on file.   Social History Main Topics  . Smoking status: Never Smoker   . Smokeless tobacco: Not on file  . Alcohol Use: No     Comment: Rare  . Drug Use: No  . Sexually Active: No   Other Topics Concern  . Not on file   Social History Narrative   Retired Physiological scientist.     4 children    Past Surgical History  Procedure Laterality Date  . Lipoma excision      benign, neck  . Cystectomy      bladder  . Pelvic laparoscopy       Family History  Problem Relation Age of Onset  . Hyperlipidemia Father   . Heart disease Father   . Hypertension Father   . Uterine cancer Sister     Allergies  Allergen Reactions  . Contrast Media (Iodinated Diagnostic Agents) Hives  . Penicillins Hives  . Codeine   . Crab (Shellfish Allergy)   . Effexor (Venlafaxine Hcl)   . Latex     Current Outpatient Prescriptions on File Prior to Visit  Medication Sig Dispense Refill  . aspirin 81 MG tablet Take 81 mg by mouth daily.      . Calcium Carbonate 1500 MG TABS Take 1 tablet by mouth daily.      . cholecalciferol (VITAMIN D) 1000 UNITS tablet Take 1,000 Units by mouth daily.      Marland Kitchen diltiazem (CARDIZEM) 60 MG tablet Take 1 tablet (60 mg total) by mouth 3 (three) times daily.  90 tablet  6  . traMADol (ULTRAM) 50 MG tablet Take 50 mg by mouth every 6 (six) hours as needed.       No current facility-administered medications on file prior to visit.    BP 122/80  Temp(Src) 97.9 F (36.6 C) (Oral)  Wt 132 lb (59.875 kg)  BMI 24.95 kg/m2  Objective:   Physical Exam  Constitutional: She is oriented to person, place, and time.  Pleasant, frail 79 year old  HENT:  Head: Normocephalic and atraumatic.  Cardiovascular: Normal rate, regular rhythm and normal heart sounds.   Pulmonary/Chest: Effort normal and breath sounds normal. She has no wheezes.  Abdominal:  Suprapubic and left lower quadrant tenderness  Musculoskeletal: She exhibits no edema.  Neurological: She is alert and oriented to person, place, and time. No cranial nerve deficit.  Skin: Skin is warm and dry.  Psychiatric: Her behavior is normal.          Assessment & Plan:

## 2012-11-24 NOTE — Assessment & Plan Note (Signed)
Patient advised to start statin therapy by her cardiologist. She is reluctant to take Lipitor. Trial of Crestor 5 mg once daily. Repeat LFTs and fasting lipid panel in 2 weeks.

## 2012-11-24 NOTE — Assessment & Plan Note (Signed)
Patient experiencing intermittent suprapubic tenderness of the last 5 days. Urinalysis suggestive of UTI. Treat with Ceftin 250 mg twice daily for 7 days.  Patient advised to call office if symptoms persist or worsen.

## 2012-11-24 NOTE — Patient Instructions (Signed)
Please complete the following lab tests before your next follow up appointment: LFTs, FLP - 272.4

## 2012-11-24 NOTE — Assessment & Plan Note (Addendum)
Patient has chronic generalized anxiety disorder. She was previously on Lexapro in the remote past but discontinued. She is reluctant to try sertraline and requests Ativan. I recommended against using long-acting benzodiazepines in geriatric patients. Start citalopram 20 mg once daily. Use low-dose alprazolam sparingly for severe panic/anxiety symptoms.  Reassess in 4 weeks

## 2012-11-26 ENCOUNTER — Ambulatory Visit: Payer: Medicare Other | Admitting: Internal Medicine

## 2012-11-26 LAB — URINE CULTURE: Colony Count: >=100000

## 2012-12-02 ENCOUNTER — Telehealth: Payer: Self-pay | Admitting: Internal Medicine

## 2012-12-02 NOTE — Telephone Encounter (Signed)
Pt would like a UA test done w/ her labs on 2/28. She would like to confirm her UTI is gone.

## 2012-12-02 NOTE — Telephone Encounter (Signed)
Added to labs

## 2012-12-07 ENCOUNTER — Other Ambulatory Visit: Payer: Medicare Other

## 2012-12-08 ENCOUNTER — Telehealth: Payer: Self-pay | Admitting: Internal Medicine

## 2012-12-08 NOTE — Telephone Encounter (Signed)
Patient Information:  Caller Name: [name]  Phone: [phone]  Patient: Ashley Walter, Ashley Walter  Gender: Female  DOB: [birth year]  Age: 79 Years  PCP: Artist Pais Doe-Hyun Molly Maduro) (Adults only)  Office Follow Up:  Does the office need to follow up with this patient?: No  Instructions For The Office: N/A   Symptoms  Reason For Call & Symptoms: Saw Dr Artist Pais 2/15 with UTI, given Ceftin 250mg  BID for 7 days.  Has felt lightheaded at intervals so stayed in bed today to rest.  Started with intermittent discomfort in Left lower abdomen on Mon 2/24, abdomen bloating, discomfort worse if coughing or moving, thinks may be diverticulitis.  MD in [place] usually gave higher dose of Ceftin for this kind of discomfort.  Rates pain at 5/10.  Reviewed Health History In EMR: Yes  Reviewed Medications In EMR: Yes  Reviewed Allergies In EMR: Yes  Reviewed Surgeries / Procedures: Yes  Date of Onset of Symptoms: 12/06/2012  Guideline(s) Used:  Abdominal Pain - Female  Disposition Per Guideline:   See Today in Office  Reason For Disposition Reached:   Moderate or mild pain that comes and goes (cramps) lasts > 24 hours  Advice Given:  N/A  Patient Refused Recommendation:  Patient Will Follow Up With Office Later  Does not want to go to [facility] or ER, only to see Dr Artist Pais.  Kendal Hymen called from office to help set up Appt.

## 2012-12-08 NOTE — Telephone Encounter (Signed)
Appointment given for 3:30

## 2012-12-08 NOTE — Telephone Encounter (Signed)
Pt was seen last week. Pt was diagnosed w/ possible UTI. Pt is not feeling and would like to discuss this with you. Pls call.

## 2012-12-09 ENCOUNTER — Encounter: Payer: Self-pay | Admitting: Internal Medicine

## 2012-12-09 ENCOUNTER — Ambulatory Visit (INDEPENDENT_AMBULATORY_CARE_PROVIDER_SITE_OTHER): Payer: Medicare Other | Admitting: Internal Medicine

## 2012-12-09 VITALS — BP 134/80 | Temp 97.8°F | Wt 132.0 lb

## 2012-12-09 DIAGNOSIS — R1032 Left lower quadrant pain: Secondary | ICD-10-CM | POA: Insufficient documentation

## 2012-12-09 DIAGNOSIS — N390 Urinary tract infection, site not specified: Secondary | ICD-10-CM

## 2012-12-09 LAB — POCT URINALYSIS DIPSTICK
Ketones, UA: NEGATIVE
Protein, UA: NEGATIVE
Spec Grav, UA: 1.01
pH, UA: 6

## 2012-12-09 LAB — CBC WITH DIFFERENTIAL/PLATELET
Basophils Relative: 0.3 % (ref 0.0–3.0)
Eosinophils Absolute: 0.1 10*3/uL (ref 0.0–0.7)
HCT: 42.9 % (ref 36.0–46.0)
Hemoglobin: 14.4 g/dL (ref 12.0–15.0)
Lymphs Abs: 1.8 10*3/uL (ref 0.7–4.0)
MCHC: 33.6 g/dL (ref 30.0–36.0)
MCV: 87.9 fl (ref 78.0–100.0)
Monocytes Absolute: 0.7 10*3/uL (ref 0.1–1.0)
Neutro Abs: 6.7 10*3/uL (ref 1.4–7.7)
RBC: 4.88 Mil/uL (ref 3.87–5.11)

## 2012-12-09 MED ORDER — CEFUROXIME AXETIL 250 MG PO TABS: 250.0000 mg | ORAL_TABLET | Freq: Two times a day (BID) | ORAL | Status: AC

## 2012-12-09 MED ORDER — METRONIDAZOLE 250 MG PO TABS: 250.0000 mg | ORAL_TABLET | Freq: Three times a day (TID) | ORAL | Status: DC

## 2012-12-09 NOTE — Assessment & Plan Note (Signed)
79 year old white female with persistent left lower quadrant pain for 2-3 weeks. She was treated for possible UTI last week. It improved her suprapubic discomfort she has persistent left lower quadrant pain. Obtain CBCD, sedimentation rate and CT of abdomen and pelvis. She cannot tolerate Cipro due to rash. Use cefuroxime 250 mg twice a day 10 days and metronidazole 250 mg 3 times a day for 10 days.

## 2012-12-09 NOTE — Patient Instructions (Addendum)
Please contact our office if your symptoms do not improve or gets worse.  

## 2012-12-09 NOTE — Progress Notes (Signed)
Subjective:    Patient ID: Ashley Walter, female    DOB: [birth year], 79 y.o.   MRN: [id number]  HPI  79 year old white female with history of coronary artery disease previously seen for lower abdominal pain for followup. Patient had suprapubic discomfort and abnormal urinalysis. Patient treated for presumed bladder infection with cefuroxime 250 mg twice daily for 7 days. Urine culture grew strep agalactae. Patient reports suprapubic discomfort has improved, however patient she is still experiencing left lower quadrant/left-sided abdominal pain.  She denies diarrhea. She has intermittent constipation  Patient tried Crestor for several days but discontinued because she thought her abdominal symptoms may be side effect from cholesterol medication.  She reports Cipro caused rash in the past.  Review of Systems Negative for fever or chills, negative for nausea or vomiting  Past Medical History  Diagnosis Date  . Hyperlipidemia   . Arthritis   . Allergy   . GERD (gastroesophageal reflux disease)   . Diverticulitis   . CAD (coronary artery disease)     Occlusion of the circumflex with clot 1992 in [place].  Managed medically.   Marland Kitchen History of chickenpox   . Spinal stenosis, lumbar   . Benign bladder tumor 2005  . Osteoporosis   . SVT (supraventricular tachycardia)     History   Social History  . Marital Status: Widowed    Spouse Name: N/A    Number of Children: N/A  . Years of Education: N/A   Occupational History  . Not on file.   Social History Main Topics  . Smoking status: Never Smoker   . Smokeless tobacco: Not on file  . Alcohol Use: No     Comment: Rare  . Drug Use: No  . Sexually Active: No   Other Topics Concern  . Not on file   Social History Narrative   Retired Physiological scientist.     4 children    Past Surgical History  Procedure Laterality Date  . Lipoma excision      benign, neck  . Cystectomy      bladder  . Pelvic laparoscopy      Family History   Problem Relation Age of Onset  . Hyperlipidemia Father   . Heart disease Father   . Hypertension Father   . Uterine cancer Sister     Allergies  Allergen Reactions  . Contrast Media (Iodinated Diagnostic Agents) Hives  . Penicillins Hives  . Codeine   . Crab (Shellfish Allergy)   . Effexor (Venlafaxine Hcl)   . Latex     Current Outpatient Prescriptions on File Prior to Visit  Medication Sig Dispense Refill  . ALPRAZolam (XANAX) 0.25 MG tablet Take 0.5 tablets (0.125 mg total) by mouth daily as needed for anxiety.  30 tablet  0  . aspirin 81 MG tablet Take 81 mg by mouth daily.      . Calcium Carbonate 1500 MG TABS Take 1 tablet by mouth daily.      . cholecalciferol (VITAMIN D) 1000 UNITS tablet Take 1,000 Units by mouth daily.      Marland Kitchen diltiazem (CARDIZEM) 60 MG tablet Take 1 tablet (60 mg total) by mouth 3 (three) times daily.  90 tablet  6  . traMADol (ULTRAM) 50 MG tablet Take 50 mg by mouth every 6 (six) hours as needed.      . CELEXA 20 MG tablet 1/2 tablet once daily for 7 days, then 1 tablet once daily  30 tablet  2  . rosuvastatin (CRESTOR)  5 MG tablet Take 1 tablet (5 mg total) by mouth daily.  30 tablet  0   No current facility-administered medications on file prior to visit.    BP 134/80  Temp(Src) 97.8 F (36.6 C) (Oral)  Wt 132 lb (59.875 kg)  BMI 24.95 kg/m2       Objective:   Physical Exam  Constitutional: She appears well-developed and well-nourished.  HENT:  Head: Normocephalic and atraumatic.  Cardiovascular: Normal rate, regular rhythm and normal heart sounds.   Pulmonary/Chest: Effort normal and breath sounds normal. She has no wheezes.  Abdominal: Soft. Bowel sounds are normal.  LLQ tenderness,  No rebound or guarding.          Assessment & Plan:

## 2012-12-09 NOTE — Assessment & Plan Note (Signed)
Patient's previous urine culture showed group B strep. She was treated with cefuroxime 250 mg twice daily for 7 days. Her UA today is again notable for leukocytosis. Repeat urine culture.

## 2012-12-10 ENCOUNTER — Other Ambulatory Visit: Payer: Medicare Other

## 2012-12-10 ENCOUNTER — Telehealth: Payer: Self-pay | Admitting: Internal Medicine

## 2012-12-10 ENCOUNTER — Ambulatory Visit (INDEPENDENT_AMBULATORY_CARE_PROVIDER_SITE_OTHER)
Admission: RE | Admit: 2012-12-10 | Discharge: 2012-12-10 | Disposition: A | Discharge status: Home / Self Care | Payer: Medicare Other | Source: Ambulatory Visit | Attending: Internal Medicine | Admitting: Internal Medicine

## 2012-12-10 DIAGNOSIS — R1032 Left lower quadrant pain: Secondary | ICD-10-CM

## 2012-12-10 NOTE — Telephone Encounter (Signed)
Results discussed with pt

## 2012-12-10 NOTE — Telephone Encounter (Signed)
Pt just left [place] ct department and will be waiting for results. Pt call on her daughter Melina Fiddler cell phone [phone]

## 2012-12-11 LAB — URINE CULTURE: Colony Count: >=100000

## 2012-12-14 ENCOUNTER — Other Ambulatory Visit: Payer: Self-pay | Admitting: Internal Medicine

## 2012-12-14 ENCOUNTER — Other Ambulatory Visit: Payer: Medicare Other

## 2012-12-14 ENCOUNTER — Telehealth: Payer: Self-pay | Admitting: Internal Medicine

## 2012-12-14 DIAGNOSIS — N8110 Cystocele, unspecified: Secondary | ICD-10-CM

## 2012-12-14 DIAGNOSIS — N390 Urinary tract infection, site not specified: Secondary | ICD-10-CM

## 2012-12-14 NOTE — Telephone Encounter (Signed)
Patient Information:  Caller Name: [name]  Phone: [phone]  Patient: Konica, Stankowski  Gender: Female  DOB: 07-15-1926  Age: 79 Years  PCP: Artist Pais Doe-Hyun Molly Maduro) (Adults only)  Office Follow Up:  Does the office need to follow up with this patient?: Yes  Instructions For The Office: Wants to speak with MD only  RN Note:  Patient wants to speak with MD only, will not go into detail on what her sx are, states that she talked with Arline Asp earlier and wanted to speak with MD "before his day started and here it is 1:15".  Symptoms  Reason For Call & Symptoms: Wanting to speak with MD only  Reviewed Health History In EMR: N/A  Reviewed Medications In EMR: N/A  Reviewed Allergies In EMR: N/A  Reviewed Surgeries / Procedures: N/A  Date of Onset of Symptoms: 12/13/2012  Guideline(s) Used:  No Protocol Available - Sick Adult  Disposition Per Guideline:   See Today or Tomorrow in Office  Reason For Disposition Reached:   Nursing judgment  Advice Given:  N/A

## 2012-12-14 NOTE — Telephone Encounter (Signed)
Patient calling back, it is nearing the end of the day and she has yet to hear from Dr. Artist Pais.  She asked what time I spoke with her earlier and same ws 1337.  She said that that was the second phone call she made today. She was told that her urine specimen was "contaminated" .  Wants to know if he got her earlier messages and if she can leave now and come to the office to give a urine specimen only?  I advised her again that he is in seeing patients and that patients needs to have an OV scheduled, that there are no lab visit slots.  She is very upset and wants this done.  I called the office and spoke with Fleet Contras.  She spoke with Arline Asp and she said that Dr. Artist Pais is in with a patient but that she would give her the message personally as soon as he came out of the office. I shared this information with her.  She is still upset, states that she saw him twice last week and still has her symptoms and has another appointment scheduled for next week and only wants to come by and leave a urine specimen.  I assured her that Arline Asp would give him the message and get back with her.

## 2012-12-14 NOTE — Telephone Encounter (Signed)
Pt is coming in for a urine culture per Dr Artist Pais

## 2012-12-15 LAB — URINE CULTURE
Colony Count: NO GROWTH
Organism ID, Bacteria: NO GROWTH

## 2012-12-16 ENCOUNTER — Telehealth: Payer: Self-pay | Admitting: Internal Medicine

## 2012-12-17 ENCOUNTER — Other Ambulatory Visit: Payer: Medicare Other

## 2012-12-18 NOTE — Telephone Encounter (Signed)
This has already been taken care of

## 2012-12-20 ENCOUNTER — Ambulatory Visit: Payer: Medicare Other | Admitting: Internal Medicine

## 2012-12-21 ENCOUNTER — Ambulatory Visit: Payer: Medicare Other | Admitting: Internal Medicine

## 2012-12-22 ENCOUNTER — Ambulatory Visit: Payer: Medicare Other | Admitting: Internal Medicine

## 2012-12-24 ENCOUNTER — Telehealth: Payer: Self-pay | Admitting: Cardiology

## 2012-12-24 ENCOUNTER — Encounter: Payer: Self-pay | Admitting: Cardiology

## 2012-12-24 ENCOUNTER — Ambulatory Visit (INDEPENDENT_AMBULATORY_CARE_PROVIDER_SITE_OTHER)
Admission: RE | Admit: 2012-12-24 | Discharge: 2012-12-24 | Disposition: A | Discharge status: Home / Self Care | Payer: Medicare Other | Source: Ambulatory Visit | Attending: Cardiology | Admitting: Cardiology

## 2012-12-24 ENCOUNTER — Ambulatory Visit (INDEPENDENT_AMBULATORY_CARE_PROVIDER_SITE_OTHER): Payer: Medicare Other | Admitting: Cardiology

## 2012-12-24 VITALS — BP 111/69 | HR 70 | Ht 61.0 in | Wt 130.4 lb

## 2012-12-24 DIAGNOSIS — R200 Anesthesia of skin: Secondary | ICD-10-CM

## 2012-12-24 DIAGNOSIS — R29818 Other symptoms and signs involving the nervous system: Secondary | ICD-10-CM

## 2012-12-24 DIAGNOSIS — R209 Unspecified disturbances of skin sensation: Secondary | ICD-10-CM

## 2012-12-24 DIAGNOSIS — I2510 Atherosclerotic heart disease of native coronary artery without angina pectoris: Secondary | ICD-10-CM

## 2012-12-24 DIAGNOSIS — R2990 Unspecified symptoms and signs involving the nervous system: Secondary | ICD-10-CM

## 2012-12-24 NOTE — Telephone Encounter (Signed)
New Problem:    Patient's daughter called in because the patient had a possible mini stroke last evening and would like to be seen in the practice today.  Please call back.

## 2012-12-24 NOTE — Telephone Encounter (Signed)
Patient's daughter states pt had like a mini stroke  episode last night.  Patient's  arm was shaken and became numb. Pt was oriented at the time , able to move all extremities, had  bilateral equal hand grip.This episode  lasted a minute or two , pt was frightened. Pt is afraid that this will happened again. Daughter would like to know if she can be seen today .    Daughter was insisting for pt to be seen today. An appointment was made for pt. At 10:45 AM with Dr. Antoine Poche. Daughter aware.

## 2012-12-24 NOTE — Progress Notes (Signed)
HPI The patient presents for evaluation of multiple symptoms including numbess.  She thought that she might be having a TIA.  She called to be added to my schedule.  My first visit with our outlines her previous cardiac problems. She previously lived in [place]. She does have a history of coronary disease with a reported occlusion of her circumflex artery with thrombus in 1992. She was apparently managed medically. She has had recurrent supraventricular tachycardia with palpitations managed medically.  She was started on Celexa about two weeks ago but doesn't think that she is tolerating this and is trying to take herself off of if.  In the meantime she has been treated for a UTI.  She has had increased stress.  Last night she though got up off the toilet and thought said that she was dizzy and had brief shaking of her left arm.  She has had a vague numb sensation on the left side. However, she never had a true motor, speech or visual deficit.  She was given juice and ASA and food by her daughter when this happened last night. She refused to go to the ER.  The patient denies any new symptoms such as chest discomfort, neck or arm discomfort. There has been no new shortness of breath, PND or orthopnea. There have been no reported palpitations, presyncope or syncope.    Allergies  Allergen Reactions  . Contrast Media (Iodinated Diagnostic Agents) Hives  . Penicillins Hives  . Codeine   . Crab (Shellfish Allergy)   . Effexor (Venlafaxine Hcl)   . Latex   . Ciprofloxacin Hcl Rash    Current Outpatient Prescriptions  Medication Sig Dispense Refill  . ALPRAZolam (XANAX) 0.25 MG tablet Take 0.5 tablets (0.125 mg total) by mouth daily as needed for anxiety.  30 tablet  0  . aspirin 81 MG tablet Take 81 mg by mouth daily.      Marland Kitchen atorvastatin (LIPITOR) 20 MG tablet Take 10 mg by mouth daily.      . Calcium Carbonate 1500 MG TABS Take 1 tablet by mouth daily.      . cholecalciferol (VITAMIN D) 1000  UNITS tablet Take 1,000 Units by mouth daily.      . citalopram (CELEXA) 20 MG tablet 10 mg. Every other day      . diltiazem (CARDIZEM) 60 MG tablet Take 1 tablet (60 mg total) by mouth 3 (three) times daily.  90 tablet  6  . traMADol (ULTRAM) 50 MG tablet Take 50 mg by mouth every 6 (six) hours as needed.       No current facility-administered medications for this visit.    Past Medical History  Diagnosis Date  . Hyperlipidemia   . Arthritis   . Allergy   . GERD (gastroesophageal reflux disease)   . Diverticulitis   . CAD (coronary artery disease)     Occlusion of the circumflex with clot 1992 in [place].  Managed medically.   Marland Kitchen History of chickenpox   . Spinal stenosis, lumbar   . Benign bladder tumor 2005  . Osteoporosis   . SVT (supraventricular tachycardia)     Past Surgical History  Procedure Laterality Date  . Lipoma excision      benign, neck  . Cystectomy      bladder  . Pelvic laparoscopy      ROS: Recent diarrhea.   Otherwise as stated in the HPI and negative for all other systems.  PHYSICAL EXAM BP 111/69  Pulse 70  Ht 5\' 1"  (1.549 m)  Wt 130 lb 6.4 oz (59.149 kg)  BMI 24.65 kg/m2 GENERAL:  Well appearing HEENT:  Pupils equal round and reactive, fundi not visualized, oral mucosa unremarkable NECK:  No jugular venous distention, waveform within normal limits, carotid upstroke brisk and symmetric, no bruits, no thyromegaly LYMPHATICS:  No cervical, inguinal adenopathy LUNGS:  Clear to auscultation bilaterally BACK:  No CVA tenderness, lordosis CHEST:  Unremarkable HEART:  PMI not displaced or sustained,S1 and S2 within normal limits, no S3, no S4, no clicks, no rubs, no murmurs ABD:  Flat, positive bowel sounds normal in frequency in pitch, no bruits, no rebound, no guarding, no midline pulsatile mass, no hepatomegaly, no splenomegaly EXT:  2 plus pulses throughout, no edema, no cyanosis no clubbing SKIN:  No rashes no nodules NEURO:  Cranial nerves II  through XII grossly intact, motor grossly intact throughout PSYCH:  Cognitively intact, oriented to person place and time   ASSESSMENT AND PLAN  CAD -  The patient has no new sypmtoms.   At this time no further cardiovascular testing is indicated.    ANXIETY - I think this is a major component to current complaints. I have asked her not to stop the Celexa on her own but to discuss this with Dr. Artist Pais.  DYSLIPIDEMIA - She stopped Crestor and is putting herself back on Lipitor.  She doesn't think that she is tolerating the Crestor.    DIZZINESS -  She has a nonfocal neuro exam.  Symptoms are atypical.  I will check a head CT today.  I will check and echo for possible source of TIA and carotid Dopplers.  However, I see no objective evidence that this was a neurologic event.  I talked to her along time about going to the ER if she has further episodes.

## 2012-12-24 NOTE — Patient Instructions (Signed)
Your physician recommends that you schedule a follow-up appointment in: PENDING TEST RESULTS Your physician recommends that you continue on your current medications as directed. Please refer to the Current Medication list given to you today.  Your physician has requested that you have an echocardiogram. Echocardiography is a painless test that uses sound waves to create images of your heart. It provides your doctor with information about the size and shape of your heart and how well your heart's chambers and valves are working. This procedure takes approximately one hour. There are no restrictions for this procedure.  Your physician has requested that you have a carotid duplex. This test is an ultrasound of the carotid arteries in your neck. It looks at blood flow through these arteries that supply the brain with blood. Allow one hour for this exam. There are no restrictions or special instructions.   HEAD CT  R/O STROKE

## 2012-12-27 ENCOUNTER — Encounter: Payer: Self-pay | Admitting: Internal Medicine

## 2012-12-28 ENCOUNTER — Encounter (INDEPENDENT_AMBULATORY_CARE_PROVIDER_SITE_OTHER): Payer: Medicare Other

## 2012-12-28 ENCOUNTER — Telehealth: Payer: Self-pay | Admitting: Internal Medicine

## 2012-12-28 DIAGNOSIS — R42 Dizziness and giddiness: Secondary | ICD-10-CM

## 2012-12-28 DIAGNOSIS — R2990 Unspecified symptoms and signs involving the nervous system: Secondary | ICD-10-CM

## 2012-12-28 DIAGNOSIS — R209 Unspecified disturbances of skin sensation: Secondary | ICD-10-CM

## 2012-12-28 NOTE — Telephone Encounter (Signed)
Patient called stating that per her Cardiologist she was advised to call and talk with Dr. Artist Pais directly and she would like to be contacted on [phone]. Please assist.

## 2012-12-28 NOTE — Telephone Encounter (Signed)
Call interrupted.  I tried calling pt back but got voicemail.

## 2012-12-28 NOTE — Telephone Encounter (Signed)
Pt is return Dr Artist Pais call

## 2012-12-28 NOTE — Telephone Encounter (Signed)
Returned call to pt.  I agree with trying estrace cream.  She has appt with GYN.  We discussed trying non surgical options for prolapsed bladder.

## 2012-12-29 ENCOUNTER — Other Ambulatory Visit (HOSPITAL_COMMUNITY): Payer: Medicare Other

## 2012-12-29 ENCOUNTER — Other Ambulatory Visit: Payer: Self-pay | Admitting: Internal Medicine

## 2012-12-29 DIAGNOSIS — F411 Generalized anxiety disorder: Secondary | ICD-10-CM

## 2012-12-29 MED ORDER — ESCITALOPRAM OXALATE 10 MG PO TABS: 10.0000 mg | ORAL_TABLET | Freq: Every day | ORAL | Status: AC

## 2012-12-29 NOTE — Telephone Encounter (Signed)
Pt states you and she got "cut off" while on the phone yesterday.  Would like for you to call her back at her daughter's home:  [phone]

## 2012-12-29 NOTE — Telephone Encounter (Signed)
Spoke with patient re: celexa. See note for anxiety in problem list.  Patient proceed with GYN evaluation.

## 2012-12-29 NOTE — Assessment & Plan Note (Signed)
Patient reports flushing sensation with celexa.  She would like to resume lexapro.  Rx for lexapro 10 mg sent to her pharmacy.

## 2012-12-30 ENCOUNTER — Ambulatory Visit (HOSPITAL_COMMUNITY): Payer: Medicare Other | Attending: Cardiology

## 2012-12-30 DIAGNOSIS — R209 Unspecified disturbances of skin sensation: Secondary | ICD-10-CM | POA: Insufficient documentation

## 2012-12-30 DIAGNOSIS — I079 Rheumatic tricuspid valve disease, unspecified: Secondary | ICD-10-CM | POA: Insufficient documentation

## 2012-12-30 DIAGNOSIS — R2990 Unspecified symptoms and signs involving the nervous system: Secondary | ICD-10-CM

## 2012-12-30 DIAGNOSIS — I059 Rheumatic mitral valve disease, unspecified: Secondary | ICD-10-CM | POA: Insufficient documentation

## 2012-12-30 DIAGNOSIS — I2510 Atherosclerotic heart disease of native coronary artery without angina pectoris: Secondary | ICD-10-CM | POA: Insufficient documentation

## 2012-12-30 DIAGNOSIS — I359 Nonrheumatic aortic valve disorder, unspecified: Secondary | ICD-10-CM | POA: Insufficient documentation

## 2012-12-30 DIAGNOSIS — E785 Hyperlipidemia, unspecified: Secondary | ICD-10-CM | POA: Insufficient documentation

## 2012-12-30 DIAGNOSIS — I379 Nonrheumatic pulmonary valve disorder, unspecified: Secondary | ICD-10-CM | POA: Insufficient documentation

## 2012-12-30 DIAGNOSIS — I369 Nonrheumatic tricuspid valve disorder, unspecified: Secondary | ICD-10-CM

## 2012-12-30 DIAGNOSIS — R42 Dizziness and giddiness: Secondary | ICD-10-CM | POA: Insufficient documentation

## 2012-12-30 NOTE — Progress Notes (Signed)
Echocardiogram performed.  

## 2012-12-31 ENCOUNTER — Telehealth: Payer: Self-pay | Admitting: Cardiology

## 2012-12-31 NOTE — Telephone Encounter (Signed)
Follow up     Pt is not at home and want to leave another phone number that she is reachable at [phone].

## 2012-12-31 NOTE — Telephone Encounter (Signed)
Follow up   Test results

## 2012-12-31 NOTE — Telephone Encounter (Signed)
New Problem:    Patient's daughter called in wanting Dr. Antoine Poche to give her mother a call back to give her the results of both of the cardiac testing that she had done this week.  Please call back.

## 2012-12-31 NOTE — Telephone Encounter (Signed)
Discussed results of carotid doppler with range between 0 and 39 % to continue on current treatment and repeat in 2 years.  Pt wanted to know if she should increase her Lipitor and she was instructed not to do so.  She should continue her current medications and eat a low cholesterol diet.  She states understanding.  Aware echo results are not back at this time and she will be called when they are available.

## 2013-01-04 ENCOUNTER — Telehealth: Payer: Self-pay | Admitting: Cardiology

## 2013-01-04 NOTE — Telephone Encounter (Signed)
New Problem:     Ashley Walter called in wanting to know the results of the ECHO she had performed.  Would also like a written copy of her ECHO mailed to her address.  Please call back.  If her mother could not be reached at the first number listed pleas call [phone].

## 2013-01-04 NOTE — Telephone Encounter (Signed)
Unable to contact pt at number listed as her phone does not unidentified calls. Will attempt to contact at the other number listed.

## 2013-01-04 NOTE — Telephone Encounter (Signed)
Spoke with daughter who is aware of the results and will attempt to contact pt in the AM

## 2013-01-05 ENCOUNTER — Telehealth: Payer: Self-pay | Admitting: Cardiology

## 2013-01-05 NOTE — Telephone Encounter (Signed)
New problem   Pt stated she has the fax number to give for her medical records to be fax. Pt want you to call her to give you the fax. She didn't want to give it to me.

## 2013-01-05 NOTE — Telephone Encounter (Signed)
Reviewed results with pt and mailed a copy of the results to her [address] as requests.  She is aware she needs to be seen by either Dr Antoine Poche or a cardiologist there in [place].  Aware I will forward this information to Dr Antoine Poche as she would like for him to go over the results and his recommendations with her himself.  She would like to called at [phone]

## 2013-01-05 NOTE — Telephone Encounter (Signed)
Records have been faxed to Dr Ruthann Cancer as requested.

## 2013-01-05 NOTE — Telephone Encounter (Signed)
New problem     Please fax echo result to MD in [place]     Fax # [phone]. Attention Dr. Debbe Mounts.

## 2013-01-05 NOTE — Telephone Encounter (Signed)
Please see other telephone note - pt called with fax number and records faxed as requested

## 2013-01-05 NOTE — Telephone Encounter (Signed)
Echo results faxed to MD as requested.

## 2013-01-17 ENCOUNTER — Ambulatory Visit: Payer: Medicare Other | Admitting: Cardiology

## 2013-01-25 ENCOUNTER — Telehealth: Payer: Self-pay | Admitting: Cardiology

## 2013-01-25 NOTE — Telephone Encounter (Signed)
Patient called wanting to know why we haven't sent records to Arnell Sieving, MD cardiologist in [place].  Reviewed records and informed patient that ECHO results were sent to Dr. Ruthann Cancer.  Patient states that is her internist and she wants records sent to Dr. Farris Has.  States Dr. Farris Has has requested in writing some specific tests/records and she wants to know if they have been sent.  Advised patient that Dr. Antoine Poche and Elita Quick are out until 4/21 and that we would follow up at that time.

## 2013-01-25 NOTE — Telephone Encounter (Signed)
New problem    Checking to see if Ashley Walter receive information from her MD from [place]

## 2013-01-26 NOTE — Telephone Encounter (Signed)
Please send my last office note and all tests and study results to the physician she requested.  She had only given [place] instructions to send them to her primary care MD originally.

## 2013-01-27 NOTE — Telephone Encounter (Signed)
Pt is aware requested information was faxed to Dr. Farris Has. I called pt to make her aware this information was faxed today.  She states that she now is looking for a different cardiologist in [place] as "I have lost faith in Dr. Farris Has at this point". She will call back once she has found another cardiologist   She would like Dr.Hochrein to know how much she has appreciated what he has done for her & that his is a fine cardiologist & person!  I will forward this to Dr. Antoine Poche.   Mylo Red RN

## 2013-02-14 ENCOUNTER — Telehealth: Payer: Self-pay | Admitting: Cardiology

## 2013-02-14 NOTE — Telephone Encounter (Signed)
All Records & Echo of CD Mailed to Pt  [address] [place] [address] 02/14/13/KM

## 2013-02-15 ENCOUNTER — Telehealth: Payer: Self-pay | Admitting: Cardiology

## 2013-02-15 ENCOUNTER — Telehealth: Payer: Self-pay | Admitting: *Deleted

## 2013-02-15 NOTE — Telephone Encounter (Signed)
Ashley Walter called From [facility] asking [name] on CD to be Mailed to Their Medical Clinic, I let her know after speaking With Ashley Walter Yesterday (02/14/13) I was Not Supposed to Send Records I called Ashley Walter again this am Verified with Her and NO Records are to Be Sent to [facility] As She is Not Being Treated there.  02/15/13/KM

## 2013-02-15 NOTE — Telephone Encounter (Signed)
Message copied by Sharin Grave on Tue Feb 15, 2013  6:16 PM ------      Message from: Erle Crocker D      Created: Mon Feb 14, 2013 11:17 AM      Contact: [phone]       Pam,      Pt Called wanting to Speak With you, she asked that You Please      Call her back @ [phone].            Thanks,Kim  ------

## 2013-02-16 NOTE — Telephone Encounter (Signed)
Left message for pt to call back  °

## 2013-02-17 NOTE — Telephone Encounter (Signed)
Left message for pt to call back if concerns or questions

## 2013-02-18 ENCOUNTER — Telehealth: Payer: Self-pay | Admitting: Cardiology

## 2013-02-18 NOTE — Telephone Encounter (Signed)
Follow up    Returning call back to nurse.    Transfer called to medical records - patient request.

## 2013-02-22 ENCOUNTER — Telehealth: Payer: Self-pay | Admitting: Cardiology

## 2013-02-22 NOTE — Telephone Encounter (Signed)
Echo on CD (12/30/12) & Report Re-Mailed to [facility] Address [address] [place] [address] 02/22/13/KM

## 2013-11-12 ENCOUNTER — Encounter: Payer: Self-pay | Admitting: *Deleted

## 2014-08-14 ENCOUNTER — Encounter: Payer: Self-pay | Admitting: *Deleted

## 2014-10-12 ENCOUNTER — Ambulatory Visit (INDEPENDENT_AMBULATORY_CARE_PROVIDER_SITE_OTHER): Payer: Medicare Other | Admitting: Gynecology

## 2014-10-12 ENCOUNTER — Encounter: Payer: Self-pay | Admitting: Gynecology

## 2014-10-12 VITALS — Ht 59.75 in | Wt 131.0 lb

## 2014-10-12 DIAGNOSIS — N952 Postmenopausal atrophic vaginitis: Secondary | ICD-10-CM

## 2014-10-12 DIAGNOSIS — N814 Uterovaginal prolapse, unspecified: Secondary | ICD-10-CM

## 2014-10-12 DIAGNOSIS — M810 Age-related osteoporosis without current pathological fracture: Secondary | ICD-10-CM

## 2014-10-12 NOTE — Progress Notes (Signed)
   Patient presented to the office today for pessary maintenance. Patient was last seen in our office in 2013 by my partner. His note is as follows:  Patient with history of uterine prolapse who is in [place] in the summer but in [place] during the winter. Her physician in [place] fitted her for a pessary as a result of patient's uterine prolapse which is also help with her urinary incontinence. She has a change every 3-4 months and is using Estrace vaginal cream and needed a refill. Review of her record also indicated that she was diagnosed with osteoporosis and had been seen her primary care physician Dr. Shawna Orleans here in [place]. She stopped Fosamax because of GERD and he is discussed IV Reclast with her which she declined. She is overdue for bone density study. Her colonoscopy was normal in 2006 and she declines any further colonoscopy. She is overdue for mammogram.  Exam:External genitalia: No lesions present, somewhat vulvitis. BUS: Within normal limits. Vaginal exam: Significant atrophic changes. No obvious cystocele or rectocele. Cervix: Clean. Uterus: Retroverted normal size and shape with second-degree uterine descensus. Adnexa: No masses. Rectovaginal exam: Within normal limits.   The pessary was removed and replaced into the vagina no ulcerations or lesion was noted. Patient will be given a prescription for Estrace vaginal cream to apply 1-2 times per week. She will make an appointment for bone density study here in the office. She was given a requisition to schedule her mammogram. She will schedule her bone density study and they make an apartment to see me the week after to discuss the results and plan a course of management. We discussed importance of calcium vitamin D and regular exercise.

## 2014-10-12 NOTE — Patient Instructions (Signed)

## 2014-10-16 ENCOUNTER — Telehealth: Payer: Self-pay | Admitting: *Deleted

## 2014-10-16 IMAGING — CT CT HEAD W/O CM
1 series · 16 of 30 positions shown, 20 images · non-contrast
Comparison: 12/11/2004 MRI and 10/13/1998 CT report.

CLINICAL DATA: 87-year-old female with left arm numbness, weakness
and confusion.

CT HEAD WITHOUT CONTRAST
TECHNIQUE: Contiguous axial images were obtained from the base of
the skull through the vertex without contrast.

[Series 2: head_seq -c 4.5 h37s st · axial · 0.44mm/px · z∈[+93,+229]mm · 16 of 32 slices shown, 20 images]
[im 2/32  brain]
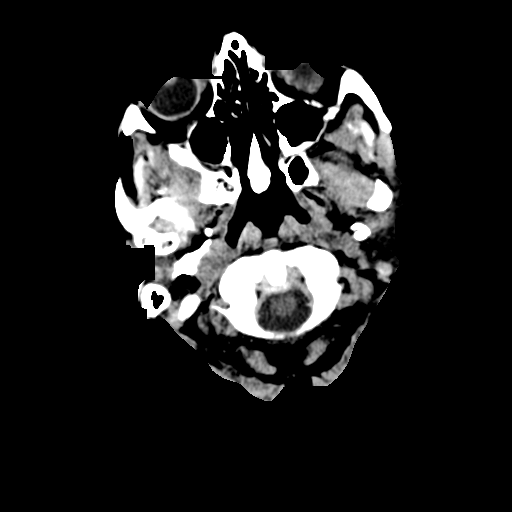
[im 2/32  bone]
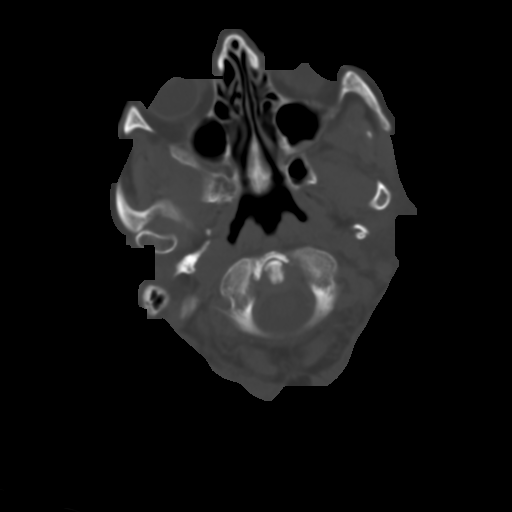
[im 4/32  brain]
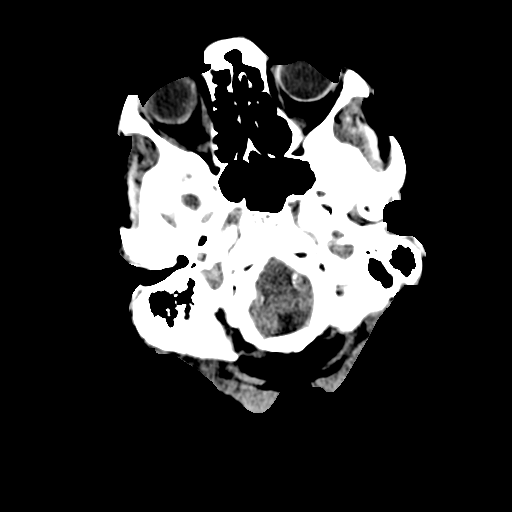
[im 6/32  brain]
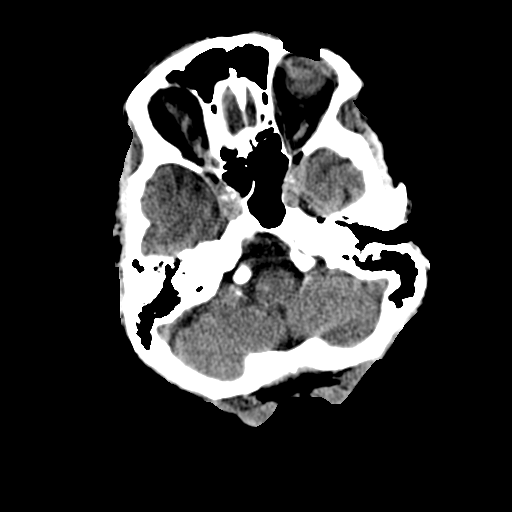
[im 8/32  brain]
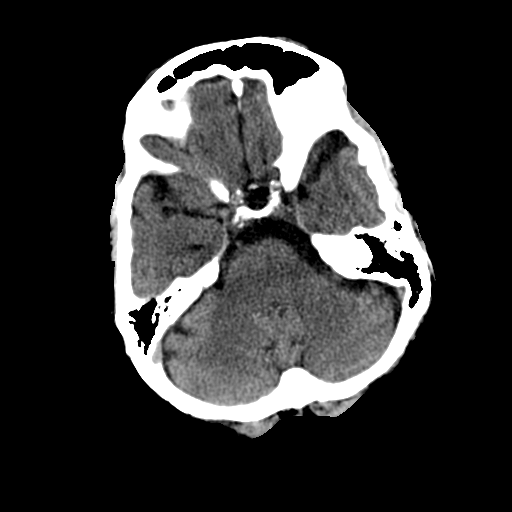
[im 9/32  brain]
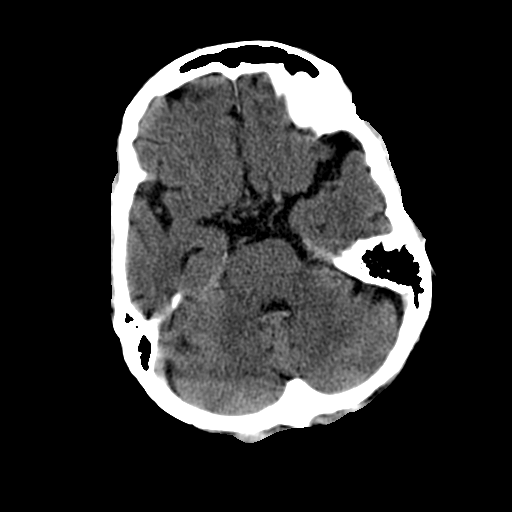
[im 9/32  bone]
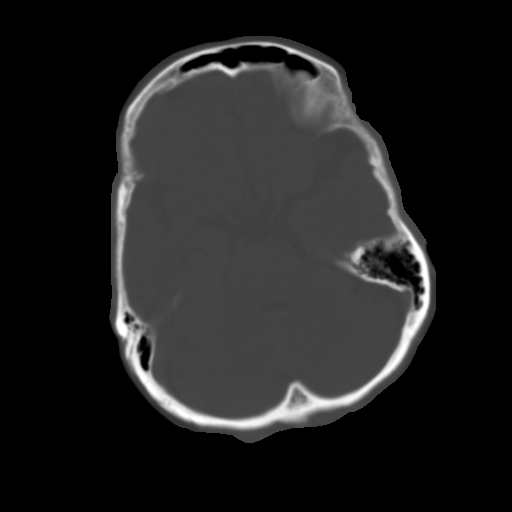
[im 11/32  brain]
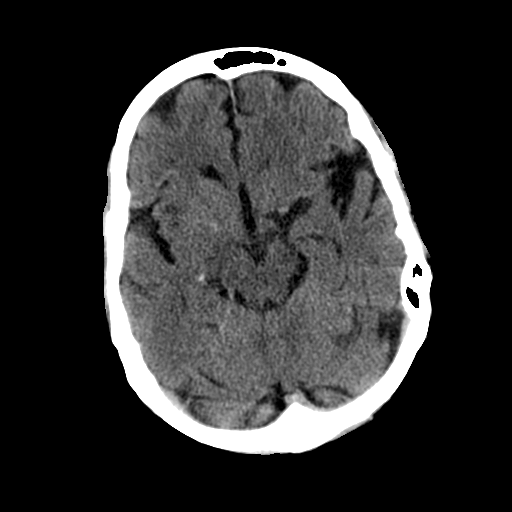
[im 13/32  brain]
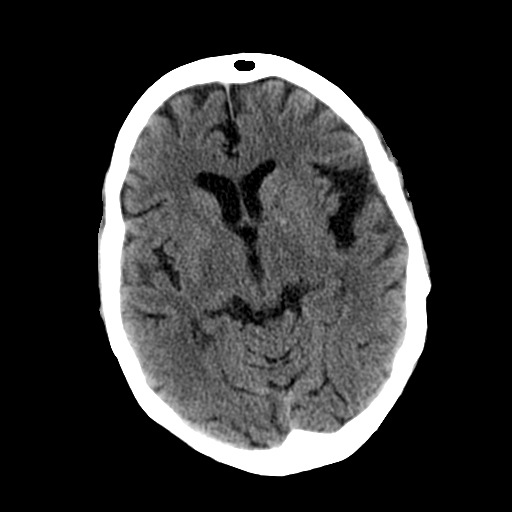
[im 15/32  brain]
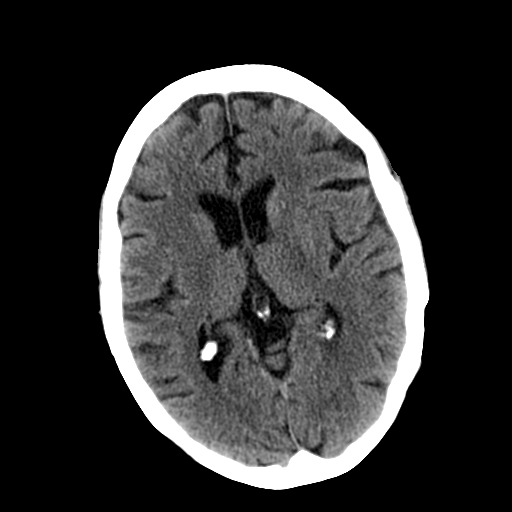
[im 17/32  brain]
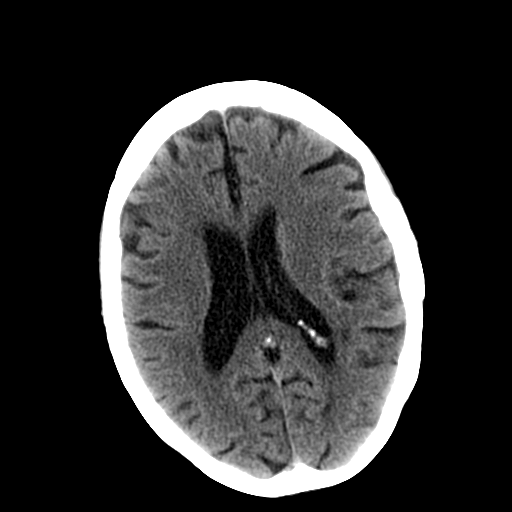
[im 17/32  bone]
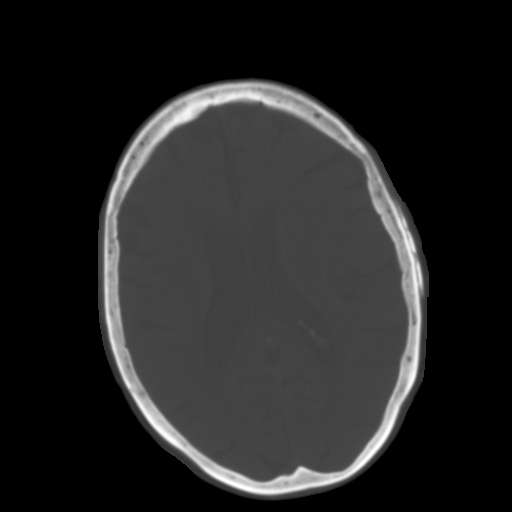
[im 19/32  brain]
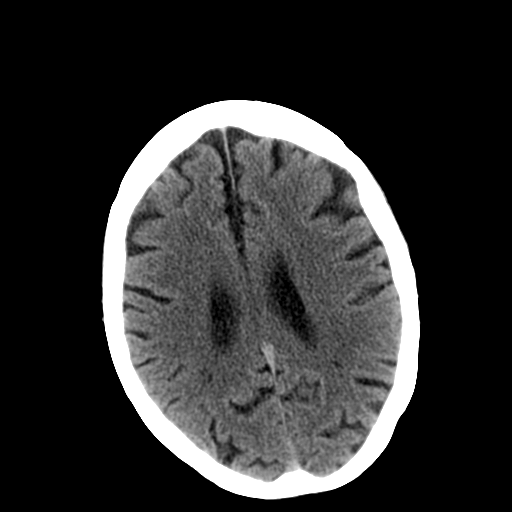
[im 21/32  brain]
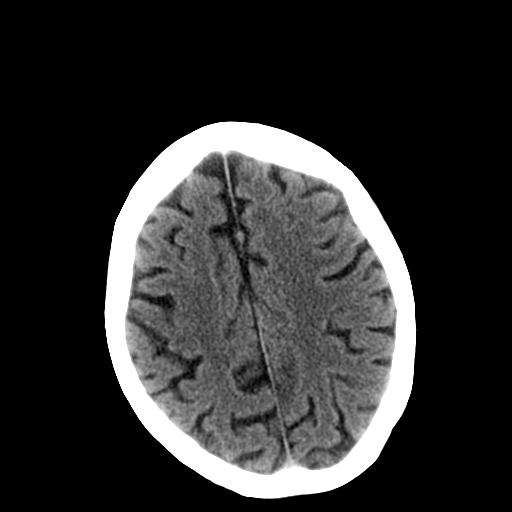
[im 23/32  brain]
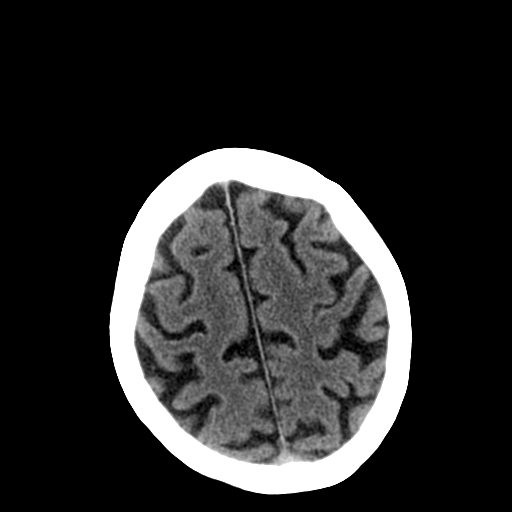
[im 24/32  brain]
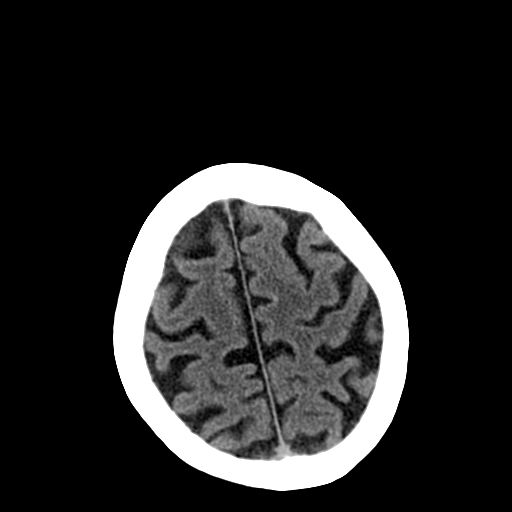
[im 24/32  bone]
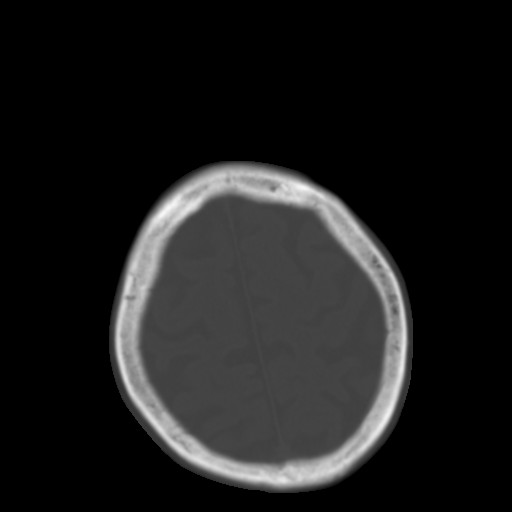
[im 26/32  brain]
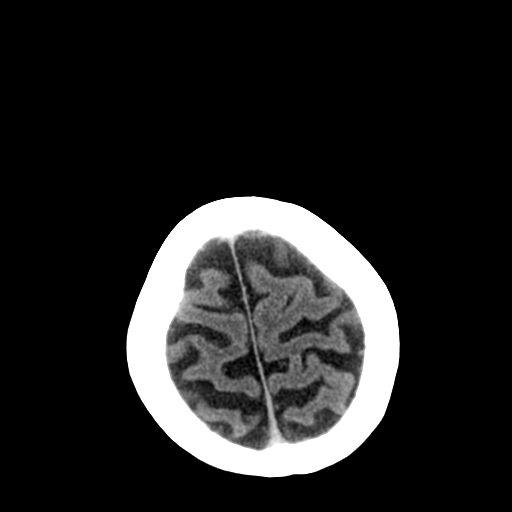
[im 28/32  brain]
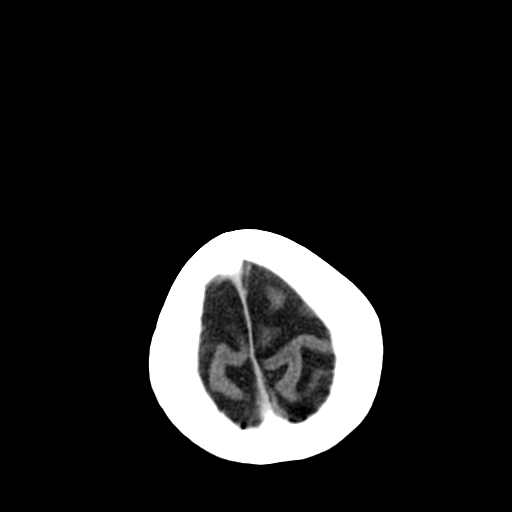
[im 30/32  brain]
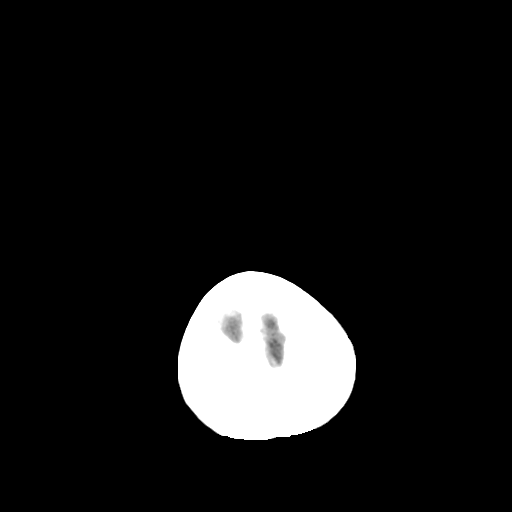

[16 of 30 positions shown; findings below may reference images not displayed]

FINDINGS: Atrophy and mild chronic small vessel white matter
ischemic changes again noted.
Faint calcifications in both basal ganglia identified.
No acute intracranial abnormalities are identified, including mass
lesion or mass effect, hydrocephalus, extra-axial fluid collection,
midline shift, hemorrhage, or acute infarction.

The visualized bony calvarium is unremarkable.
IMPRESSION: No evidence of acute intracranial abnormality.

Atrophy and mild chronic small vessel white matter ischemic
changes.

## 2014-10-16 MED ORDER — ESTRADIOL 0.1 MG/GM VA CREA: TOPICAL_CREAM | VAGINAL | Status: DC

## 2014-10-16 NOTE — Telephone Encounter (Signed)
Rx sent to pharmacy   

## 2014-10-16 NOTE — Telephone Encounter (Signed)
-----   Message from Sinclair Grooms sent at 10/16/2014  9:30 AM EST ----- Regarding: RX "Patient will be given a prescription for Estrace vaginal cream to apply 1-2 times per week." JF did not send this in. Can you send to pharmacy? Thx

## 2014-10-18 ENCOUNTER — Telehealth: Payer: Self-pay | Admitting: Cardiology

## 2014-10-18 NOTE — Telephone Encounter (Signed)
Received records from [facility] (Dr Patrcia Dolly) for appointment with Dr Percival Spanish on 10/24/14.  Records given to [facility] (medical records) for Dr Hochrein's schedule on 10/24/14.  lp

## 2014-10-24 ENCOUNTER — Ambulatory Visit (INDEPENDENT_AMBULATORY_CARE_PROVIDER_SITE_OTHER): Payer: Medicare Other | Admitting: Cardiology

## 2014-10-24 ENCOUNTER — Encounter: Payer: Self-pay | Admitting: Cardiology

## 2014-10-24 VITALS — BP 114/62 | HR 70 | Ht 61.0 in | Wt 132.6 lb

## 2014-10-24 DIAGNOSIS — R42 Dizziness and giddiness: Secondary | ICD-10-CM

## 2014-10-24 NOTE — Patient Instructions (Signed)
Dr Hochrein recommends that you schedule a follow-up appointment in 6 months. You will receive a reminder letter in the mail two months in advance. If you don't receive a letter, please call our office to schedule the follow-up appointment. 

## 2014-10-24 NOTE — Progress Notes (Signed)
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 She was apparently managed medically. She has had recurrent supraventricular tachycardia with palpitations managed medically.  She presents for routine followup. She stays here sometimes but really lives in [place]. She was in [place] this summer and she did have some routine testing to include a stress test which was negative (stress perfusion) and an echocardiogram which was essentially unremarkable. I have been able to review these results. She has very as complaints to include dizziness and some dyspnea. However, these are not new complaints. She's not describing any new chest pressure, neck or arm discomfort. She's had no weight gain or edema.  Of note she was tried on twice daily correlate since I last saw her but didn't tolerate this. She was told to stop the beta blocker but she went to once daily and she says she feels better with this although it is hard to qualify or quantify symptoms.   Allergies  Allergen Reactions  . Contrast Media [Iodinated Diagnostic Agents] Hives  . Penicillins Hives  . Codeine   . Crab [Shellfish Allergy]   . Effexor [Venlafaxine Hcl]   . Latex   . Mycinaire Nasal Mist [Sodium Chloride]   . Ciprofloxacin Hcl Rash    Current Outpatient Prescriptions  Medication Sig Dispense Refill  . ALPRAZolam (XANAX) 0.25 MG tablet Take 0.5 tablets (0.125 mg total) by mouth daily as needed for anxiety. 30 tablet 0  . aspirin 81 MG tablet Take 81 mg by mouth daily.    Marland Kitchen atorvastatin (LIPITOR) 20 MG tablet Take 10 mg by mouth daily.    . Calcium Carbonate 1500 MG TABS Take 1 tablet by mouth daily.    . cholecalciferol (VITAMIN D) 1000 UNITS tablet Take 1,000 Units by mouth daily.    Marland Kitchen COREG 3.125 MG tablet Take 3.125 mg by mouth daily.   2  . escitalopram (LEXAPRO) 10 MG tablet Take 1 tablet (10 mg total) by mouth daily. 90 tablet 1   . estradiol (ESTRACE VAGINAL) 0.1 MG/GM vaginal cream Apply 1 gram vaginally 1-2 times weekly 42.5 g 12  . traMADol (ULTRAM) 50 MG tablet Take 50 mg by mouth every 6 (six) hours as needed.     No current facility-administered medications for this visit.    Past Medical History  Diagnosis Date  . Hyperlipidemia   . Arthritis   . Allergy   . GERD (gastroesophageal reflux disease)   . Diverticulitis   . CAD (coronary artery disease)     Occlusion of the circumflex with clot 1992 in [place].  Managed medically.   Marland Kitchen History of chickenpox   . Spinal stenosis, lumbar   . Benign bladder tumor 2005  . Osteoporosis   . SVT (supraventricular tachycardia)   . Hypertension     Past Surgical History  Procedure Laterality Date  . Lipoma excision      benign, neck  . Cystectomy      bladder  . Pelvic laparoscopy      ROS: Recent diarrhea.   Otherwise as stated in the HPI and negative for all other systems.  PHYSICAL EXAM BP 114/62 mmHg  Pulse 70  Ht 5\' 1"  (1.549 m)  Wt 132 lb 9.6 oz (60.147 kg)  BMI 25.07 kg/m2 GENERAL:  Well appearing HEENT:  Pupils equal round and reactive, fundi not visualized, oral mucosa unremarkable NECK:  No jugular venous distention, waveform within normal  limits, carotid upstroke brisk and symmetric, no bruits, no thyromegaly LYMPHATICS:  No cervical, inguinal adenopathy LUNGS:  Clear to auscultation bilaterally BACK:  No CVA tenderness, lordosis CHEST:  Unremarkable HEART:  PMI not displaced or sustained,S1 and S2 within normal limits, no S3, no S4, no clicks, no rubs, no murmurs ABD:  Flat, positive bowel sounds normal in frequency in pitch, no bruits, no rebound, no guarding, no midline pulsatile mass, no hepatomegaly, no splenomegaly EXT:  2 plus pulses throughout, no edema, no cyanosis no clubbing SKIN:  No rashes no nodules NEURO:  Cranial nerves II through XII grossly intact, motor grossly intact throughout PSYCH:  Cognitively intact, oriented to  person place and time  EKG: Sinus rhythm, rate 70, left bundle branch block , no change from previous. 10/24/2014  ASSESSMENT AND PLAN  CAD -   The patient has no new sypmtoms.   At this time no further cardiovascular testing is indicated.    DYSLIPIDEMIA - I will defer follow up to her PCP.   DIZZINESS -   This is mild.  No change in therapy is indicated.    DYSPNEA - We did walk the patient around the office her oxygen saturations stayed in the low to mid 90s on her second lap around. She was not particularly short of breath. She does report that she tends to hold her breath. At this point no change in therapy is indicated. No further evaluation is indicated.

## 2014-10-30 ENCOUNTER — Telehealth: Payer: Self-pay | Admitting: Cardiology

## 2014-10-30 NOTE — Telephone Encounter (Signed)
Pt need a new prescription for Lipitor 10 mg #90. Please call this today to [phone].Pt wanted you to female a note that all her medicine need to be Brand Name Only.

## 2014-11-08 ENCOUNTER — Other Ambulatory Visit: Payer: Self-pay

## 2014-11-08 MED ORDER — ATORVASTATIN CALCIUM 20 MG PO TABS: 10.0000 mg | ORAL_TABLET | Freq: Every day | ORAL | Status: AC

## 2014-11-26 ENCOUNTER — Other Ambulatory Visit: Payer: Self-pay | Admitting: Cardiology

## 2014-12-04 ENCOUNTER — Telehealth: Payer: Self-pay | Admitting: Physician Assistant

## 2014-12-04 NOTE — Telephone Encounter (Signed)
Patient contacted the after hour service complaining of HTN and dizziness. However unable to recall BP measurement. He requested me to contact her daughter, Mrs Ashley Walter, who told me [name] has been around 145/92 and 154/92 for the past few days and [name] was previously on the low side.   She currently takes 3.125mg  coreg daily. I have instructed her daughter to increase to 3.125 mg BID dosing  Ashley Corrigan PA Pager: [phone]

## 2014-12-11 ENCOUNTER — Telehealth: Payer: Self-pay | Admitting: Cardiology

## 2014-12-11 NOTE — Telephone Encounter (Signed)
Spoke with patient. She states [name] is elevated and has been since Tuesday or Wednesday of last week. She states she is lightheaded, off-balance, discombobulated, forgetful.  She states Dr. Philip Aspen, her PCP, ordered her to wear a BP cuff last week at home. She is awaiting the results.   She also called in to on-call provider and was instructed to increase her carvedilol from 3.125mg  once daily to twice daily. She did not do this yet, as Dr. Shon Baton office instructed her to NOT increase the medication while wearing the BP cuff.   She states she has been on carvedilol for a while but has only been taking is once daily as the medication makes her confused, lightheaded.   [name] readings from 2/29 915am  147/92 1130am 149/97 115pm  139/78 145pm  149/98  While on phone, Dr. Shon Baton office was calling patient. She will call back   Patient returned call. The results from the patient's BP monitoring from her PCP are not back.   Informed patient that I will forward this information on to the DOD, as Dr. Percival Spanish has left for the day, but she requested a call from the physician. Explained to patient it may be a nurse returning the call and not the MD.   Also advised patient that the medication change made by the cardiology PA when they called in last week after hours should be implemented and she should monitor [name] on this increased medication dose to provide the doctor more guidance as to how to adjust BP medications.

## 2014-12-11 NOTE — Telephone Encounter (Signed)
Returned call to patient and informed her that MD was in agreement that carvedilol should be increased to twice daily. She voiced understanding of this. She still anticipates a call from Dr. Percival Spanish regarding [name]

## 2014-12-11 NOTE — Telephone Encounter (Signed)
Carvedilol will not be effective if taken only once daily. I agree with the advice to increase it to twice daily.

## 2014-12-11 NOTE — Telephone Encounter (Signed)
Pt called in stating that [name] has been high over the weekend and would like to be advise on what to do. She wants to know if her medication need to be adjusted.  Current symptoms: confusion, SOB, sweating, and lightheadness

## 2014-12-13 ENCOUNTER — Telehealth: Payer: Self-pay | Admitting: Cardiology

## 2014-12-13 NOTE — Telephone Encounter (Signed)
Spoke with pt, she only took one dose of carvedilol twice a day, she got very weak and felt confused. [name] is running 150's to 140's over 80's and 90's. She is very concerned about this. She would like a call back today Will forward to dr hochrein

## 2014-12-13 NOTE — Telephone Encounter (Signed)
BPs have been 140/90.  I reassured her.  She is fatigued and "just doesn't feel right."

## 2014-12-13 NOTE — Telephone Encounter (Signed)
Ashley Walter is calling because she is still concerned about her bp . Still anticipating a call from Dr. Percival Spanish . Please call   Thanks

## 2014-12-19 ENCOUNTER — Ambulatory Visit: Payer: Medicare Other | Admitting: Gynecology

## 2014-12-19 ENCOUNTER — Ambulatory Visit (INDEPENDENT_AMBULATORY_CARE_PROVIDER_SITE_OTHER): Payer: Medicare Other | Admitting: Women's Health

## 2014-12-19 ENCOUNTER — Encounter: Payer: Self-pay | Admitting: Women's Health

## 2014-12-19 VITALS — BP 140/80 | Ht 61.0 in | Wt 135.0 lb

## 2014-12-19 DIAGNOSIS — Z960 Presence of urogenital implants: Secondary | ICD-10-CM | POA: Insufficient documentation

## 2014-12-19 DIAGNOSIS — Z9889 Other specified postprocedural states: Secondary | ICD-10-CM

## 2014-12-19 NOTE — Progress Notes (Signed)
Patient ID: Ashley Walter, female   DOB: [birth year], 81 y.o.   MRN: [id number] Presents to have pessary removed, washed and replaced. Having good relief of uterine prolapse and incontinence. Denies discharge, odor, bleeding. Reports loneliness.  Exam: Appears well. External genitalia within normal limits, ring pessary with diaphragm removed, washed. Speculum exam no vaginal erosion, bleeding or discharge noted. Pessary replaced, tolerated well.  Pessary maintenance  Plan: Return to office in 3 months to have pessary removed, washed and replaced. Encouraged to look into senior center activities such as [facility].

## 2014-12-25 ENCOUNTER — Ambulatory Visit (INDEPENDENT_AMBULATORY_CARE_PROVIDER_SITE_OTHER): Payer: Medicare Other

## 2014-12-25 DIAGNOSIS — M810 Age-related osteoporosis without current pathological fracture: Secondary | ICD-10-CM | POA: Diagnosis not present

## 2014-12-28 ENCOUNTER — Other Ambulatory Visit: Payer: Self-pay | Admitting: Gynecology

## 2014-12-28 DIAGNOSIS — M810 Age-related osteoporosis without current pathological fracture: Secondary | ICD-10-CM

## 2015-01-01 ENCOUNTER — Telehealth: Payer: Self-pay | Admitting: *Deleted

## 2015-01-01 ENCOUNTER — Telehealth (HOSPITAL_COMMUNITY): Payer: Self-pay | Admitting: *Deleted

## 2015-01-01 NOTE — Telephone Encounter (Signed)
Pt asked if a copy of her bone density report could be mailed to her daughters address which is [address] [place] [phone] this was done. Pt lives in [place] and is here seasonal.

## 2015-01-02 ENCOUNTER — Ambulatory Visit: Payer: Medicare Other | Admitting: Gynecology

## 2015-01-04 ENCOUNTER — Other Ambulatory Visit: Payer: Self-pay | Admitting: Cardiology

## 2015-01-04 NOTE — Telephone Encounter (Signed)
We do not refill this. It needs to go to the nurse

## 2015-01-04 NOTE — Telephone Encounter (Signed)
Pt previously prescribed for Xanax by PCP. No history of [place] prescribing Ativan. Pt was advised to contact PCP for appropriate medication.

## 2015-01-04 NOTE — Telephone Encounter (Signed)
I had sent this to our clinical pool. Rose from [employer] said the doctor need to do this medicine.

## 2015-01-04 NOTE — Telephone Encounter (Signed)
Pt calling back about her prescription,I told Her Dr Warren Lacy was not in the office. She wants to know if one of the other doctor can write it for her.Please call and let her know something.

## 2015-01-04 NOTE — Telephone Encounter (Signed)
°  1. Which medications need to be refilled? Lorazepam 0.5 mg-new prescription  2. Which pharmacy is medication to be sent to?[phone] 3. Do they need a 30 day or 90 day supply? 30 and refills  4. Would they like a call back once the medication has been sent to the pharmacy? yes

## 2015-04-12 ENCOUNTER — Telehealth: Payer: Self-pay | Admitting: Cardiology

## 2015-04-12 NOTE — Telephone Encounter (Signed)
Ashley Walter is calling because she has a carotid test that Dr. Percival Spanish wants her to have , she wants to know is their a reason she needs to have this or is it just routine . Please call   Thanks

## 2015-04-12 NOTE — Telephone Encounter (Signed)
Informed patient test was recommended to be repeated in 2 years when done spring of 2014. She voiced understanding, OK to proceed w/ having this scheduled. Will send staff msg for doppler scheduling.

## 2016-07-21 ENCOUNTER — Other Ambulatory Visit: Payer: Self-pay | Admitting: Gynecology

## 2016-07-21 DIAGNOSIS — Z1231 Encounter for screening mammogram for malignant neoplasm of breast: Secondary | ICD-10-CM

## 2016-08-22 ENCOUNTER — Ambulatory Visit: Payer: Medicare Other

## 2016-09-23 ENCOUNTER — Ambulatory Visit: Payer: Medicare Other | Admitting: Gynecology

## 2016-09-26 ENCOUNTER — Ambulatory Visit (INDEPENDENT_AMBULATORY_CARE_PROVIDER_SITE_OTHER): Payer: Medicare Other | Admitting: Women's Health

## 2016-09-26 ENCOUNTER — Encounter: Payer: Self-pay | Admitting: Women's Health

## 2016-09-26 VITALS — BP 126/72 | Ht 61.0 in | Wt 119.0 lb

## 2016-09-26 DIAGNOSIS — R350 Frequency of micturition: Secondary | ICD-10-CM

## 2016-09-26 DIAGNOSIS — Z4689 Encounter for fitting and adjustment of other specified devices: Secondary | ICD-10-CM

## 2016-09-26 LAB — URINALYSIS W MICROSCOPIC + REFLEX CULTURE
BILIRUBIN URINE: NEGATIVE
CRYSTALS: NONE SEEN [HPF]
Casts: NONE SEEN [LPF]
GLUCOSE, UA: NEGATIVE
HGB URINE DIPSTICK: NEGATIVE
Nitrite: NEGATIVE
PH: 6 (ref 5.0–8.0)
Specific Gravity, Urine: 1.02 (ref 1.001–1.035)
Yeast: NONE SEEN [HPF]

## 2016-09-26 NOTE — Progress Notes (Signed)
Presents for pessary and meds, without complaint. Walking with a cane.   Exam: Appears slightly confused, having difficulty articulating how she was doing. External genitalia within normal limits, ring pessary with support was removed, washed, replaced. Speculum exam no visible erosion noted. UA: +1 leukocytes, trace ketones, 20-40 wbc's, 0-2 RBCs, 10- 20 squamous epithelials, moderate bacteria Denies urinary pain, burning or change in frequency. Spoke to friend who accompanied Ashley Walter states she was very alert this morning but now feels very fatigued causing her to be not as sharp. Ashley Walter  accounts to low blood sugar states feels better now that she has eaten a piece of candy.  Pessary maintenance for uterine prolapse  Plan: Urine culture pending. Reviewed importance of eating small frequent meals. Return to office in 3 months for pessary maintenance.

## 2016-09-28 LAB — URINE CULTURE

## 2016-09-29 ENCOUNTER — Telehealth: Payer: Self-pay

## 2016-09-29 NOTE — Telephone Encounter (Signed)
Patient informed. She said she is not feeling better. She asked if you could call her. I explained you were off today and it would be tomorrow and she was fine with you calling her tomorrow.

## 2016-09-29 NOTE — Telephone Encounter (Signed)
TC would like name of family practice and counselor, will call with them.  Please call [place] with julie Whitt's number and [facility]

## 2016-09-29 NOTE — Telephone Encounter (Signed)
I called patient and provided phone numbers.

## 2016-09-29 NOTE — Telephone Encounter (Signed)
-----   Message from Huel Cote, NP sent at 09/28/2016  1:21 PM EST ----- Please call and review urine  Culture was negative. Let me know if feeling better, she came for pessary maintenance and was a little "off", tired, not able to answer questions clearly so ran a urine to make sure not a UTI

## 2016-10-02 ENCOUNTER — Telehealth: Payer: Self-pay | Admitting: *Deleted

## 2016-10-02 NOTE — Telephone Encounter (Signed)
Message left

## 2016-10-02 NOTE — Telephone Encounter (Signed)
Pt daughter Joeseph Amor she does have DPR access would like to speak with you about patient. [phone] . The patient apparently spoke with Gerri about office visit and not being very alert.

## 2016-10-03 NOTE — Telephone Encounter (Signed)
TC has home health care and daughter are having some diff,  Questions answered

## 2016-10-16 NOTE — Progress Notes (Signed)
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 She was apparently managed medically. She has had recurrent supraventricular tachycardia with palpitations managed medically.  She presents for routine followup. She stays here sometimes but really lives in [place]. It's been 2 years since I last saw her. She's actually done relatively well. She has had some occasional chest discomfort. It happens sporadically. She feels like something might get stuck. She has to drink water in [place] and it'll go away. She doesn't recall this being a symptom she had when she was diagnosed with cardiac disease. She can do walking when she is at home and she doesn't have as discomfort. It's been a stable pattern. She did talk to her cardiologist in [place] about it. I don't think it's change since she had a stress perfusion study couple of years ago which apparently was negative. She does report an echo this past year and she thinks that was okay though I don't have results of either of those tests. She's not having any new shortness of breath, PND or orthopnea. She's not having any palpitations, presyncope or syncope.   Allergies  Allergen Reactions  . Contrast Media [Iodinated Diagnostic Agents] Hives  . Penicillins Hives  . Codeine   . Crab [Shellfish Allergy]   . Effexor [Venlafaxine Hcl]   . Latex   . Mycinaire Nasal Mist [Sodium Chloride]   . Ciprofloxacin Hcl Rash    Current Outpatient Prescriptions  Medication Sig Dispense Refill  . ALPRAZolam (XANAX) 0.25 MG tablet Take 0.5 tablets (0.125 mg total) by mouth daily as needed for anxiety. 30 tablet 0  . aspirin 81 MG tablet Take 81 mg by mouth daily.    Marland Kitchen atorvastatin (LIPITOR) 20 MG tablet Take 0.5 tablets (10 mg total) by mouth daily. 30 tablet 6  . cholecalciferol (VITAMIN D) 1000 UNITS tablet Take 1,000 Units by mouth daily.    Marland Kitchen COREG 3.125 MG tablet Take  3.125 mg by mouth 2 (two) times daily with a meal.   2  . escitalopram (LEXAPRO) 10 MG tablet Take 1 tablet (10 mg total) by mouth daily. 90 tablet 1  . traMADol (ULTRAM) 50 MG tablet Take 50 mg by mouth every 6 (six) hours as needed.     No current facility-administered medications for this visit.     Past Medical History:  Diagnosis Date  . Allergy   . Arthritis   . Benign bladder tumor 2005  . CAD (coronary artery disease)    Occlusion of the circumflex with clot 1992 in [place].  Managed medically.   . Diverticulitis   . GERD (gastroesophageal reflux disease)   . History of chickenpox   . Hyperlipidemia   . Hypertension   . Osteoporosis   . Spinal stenosis, lumbar   . SVT (supraventricular tachycardia) (HCC)     Past Surgical History:  Procedure Laterality Date  . CYSTECTOMY     bladder  . LIPOMA EXCISION     benign, neck  . PELVIC LAPAROSCOPY      ROS:    Otherwise as stated in the HPI and negative for all other systems.  PHYSICAL EXAM BP (!) 155/87 (BP Location: Left Arm, Patient Position: Sitting, Cuff Size: Normal)   Pulse 79   Ht 5' (1.524 m)   Wt 121 lb (54.9 kg)   BMI 23.63 kg/m  GENERAL:  Well appearing NECK:  No jugular venous distention, waveform within  normal limits, carotid upstroke brisk and symmetric, no bruits, no thyromegaly LUNGS:  Clear to auscultation bilaterally BACK:  No CVA tenderness, lordosis CHEST:  Unremarkable HEART:  PMI not displaced or sustained,S1 and S2 within normal limits, no S3, no S4, no clicks, no rubs, no murmurs ABD:  Flat, positive bowel sounds normal in frequency in pitch, no bruits, no rebound, no guarding, no midline pulsatile mass, no hepatomegaly, no splenomegaly EXT:  2 plus pulses throughout, no edema, no cyanosis no clubbing   EKG: Sinus rhythm, rate  69, left bundle branch block , no change from previous. 10/19/2016  ASSESSMENT AND PLAN  CAD -   The patient has no new sypmtoms.   At this time no further  cardiovascular testing is indicated.    DYSLIPIDEMIA - I will defer follow up to her PCP.   DIZZINESS -   This is mild.  No change in therapy is indicated.  She really this year describes more of an anxiety and dizziness.  DYSPNEA - Last time I walked her around the office and she had sats in the mid 90s despite some dyspnea. This time she's not complaining of increased shortness of breath. Seems to be a chronic pattern. No change in therapy is indicated.

## 2016-10-17 ENCOUNTER — Encounter: Payer: Self-pay | Admitting: Cardiology

## 2016-10-17 ENCOUNTER — Ambulatory Visit (INDEPENDENT_AMBULATORY_CARE_PROVIDER_SITE_OTHER): Payer: Medicare Other | Admitting: Cardiology

## 2016-10-17 VITALS — BP 155/87 | HR 79 | Ht 60.0 in | Wt 121.0 lb

## 2016-10-17 DIAGNOSIS — E785 Hyperlipidemia, unspecified: Secondary | ICD-10-CM | POA: Diagnosis not present

## 2016-10-17 DIAGNOSIS — I2510 Atherosclerotic heart disease of native coronary artery without angina pectoris: Secondary | ICD-10-CM | POA: Diagnosis not present

## 2016-10-17 NOTE — Patient Instructions (Signed)

## 2016-10-30 ENCOUNTER — Ambulatory Visit: Payer: Medicare Other | Admitting: Gynecology

## 2016-11-06 ENCOUNTER — Ambulatory Visit (INDEPENDENT_AMBULATORY_CARE_PROVIDER_SITE_OTHER): Payer: Medicare Other | Admitting: Gynecology

## 2016-11-06 ENCOUNTER — Encounter: Payer: Self-pay | Admitting: Gynecology

## 2016-11-06 VITALS — BP 150/82 | Ht 61.0 in | Wt 121.0 lb

## 2016-11-06 DIAGNOSIS — N814 Uterovaginal prolapse, unspecified: Secondary | ICD-10-CM | POA: Insufficient documentation

## 2016-11-06 DIAGNOSIS — I2510 Atherosclerotic heart disease of native coronary artery without angina pectoris: Secondary | ICD-10-CM | POA: Diagnosis not present

## 2016-11-06 DIAGNOSIS — M810 Age-related osteoporosis without current pathological fracture: Secondary | ICD-10-CM

## 2016-11-06 DIAGNOSIS — Z4689 Encounter for fitting and adjustment of other specified devices: Secondary | ICD-10-CM

## 2016-11-06 LAB — CALCIUM: Calcium: 9.3 mg/dL (ref 8.6–10.4)

## 2016-11-06 MED ORDER — ESTRADIOL 0.1 MG/GM VA CREA: 2.0000 g | TOPICAL_CREAM | VAGINAL | 8 refills | Status: AC

## 2016-11-06 NOTE — Progress Notes (Signed)
   Patient is an 83 year old with history of uterine prolapse. Patient travels back and forth from [place] she's here during the winter.Her physician in [place] fitted her for a pessary as a result of patient's uterine prolapse which is also help with her urinary incontinence. She has a change every 3-4 months and is using Estrace vaginal cream and needed a refill. Review of her record also indicated that she was diagnosed with osteoporosis and had been seen by her primary care physician Dr. Shawna Orleans here in [place]. She stopped Fosamax because of GERD and he recommended IV Reclas but patient declined. She has informed me that in [place] she believes in the month of October she had her first shot of Prolia but will check her records otherwise she will be due for her next shot here in April or in [place]. She is here for pessary maintenance as a result of her prolapse.   Exam:External genitalia: No lesions present, somewhat vulvitis. BUS: Within normal limits. Vaginal exam: Significant atrophic changes. No obvious cystocele or rectocele. Cervix: Clean. Uterus: Retroverted normal size and shape with second-degree uterine descensus. Adnexa: No masses. Rectovaginal exam: Within normal limits.   The pessary was removed and replaced into the vagina no ulcerations or lesion was noted. Patient will be given a prescription for Estrace vaginal cream to apply 1-2 times per week. She is due for her next bone density study this April. We will have her stop by the lab and check her calcium vitamin D level today. When she returns back in April to get her injection we'll do her pessary maintenance at the same time

## 2016-11-06 NOTE — Patient Instructions (Signed)
Bone Densitometry Introduction Bone densitometry is an imaging test that uses a special X-ray to measure the amount of calcium and other minerals in your bones (bone density). This test is also known as a bone mineral density test or dual-energy X-ray absorptiometry (DXA). The test can measure bone density at your hip and your spine. It is similar to having a regular X-ray. You may have this test to:  Diagnose a condition that causes weak or thin bones (osteoporosis).  Predict your risk of a broken bone (fracture).  Determine how well osteoporosis treatment is working. Tell a health care provider about:  Any allergies you have.  All medicines you are taking, including vitamins, herbs, eye drops, creams, and over-the-counter medicines.  Any problems you or family members have had with anesthetic medicines.  Any blood disorders you have.  Any surgeries you have had.  Any medical conditions you have.  Possibility of pregnancy.  Any other medical test you had within the previous 14 days that used contrast material. What are the risks? Generally, this is a safe procedure. However, problems can occur and may include the following:  This test exposes you to a very small amount of radiation.  The risks of radiation exposure may be greater to unborn children. What happens before the procedure?  Do not take any calcium supplements for 24 hours before having the test. You can otherwise eat and drink what you usually do.  Take off all metal jewelry, eyeglasses, dental appliances, and any other metal objects. What happens during the procedure?  You may lie on an exam table. There will be an X-ray generator below you and an imaging device above you.  Other devices, such as boxes or braces, may be used to position your body properly for the scan.  You will need to lie still while the machine slowly scans your body.  The images will show up on a computer monitor. What happens after the  procedure? You may need more testing at a later time. This information is not intended to replace advice given to you by your health care provider. Make sure you discuss any questions you have with your health care provider. Document Released: 10/21/2004 Document Revised: 03/06/2016 Document Reviewed: 03/09/2014  2017 Elsevier  

## 2016-11-07 LAB — VITAMIN D 25 HYDROXY (VIT D DEFICIENCY, FRACTURES): VIT D 25 HYDROXY: 33 ng/mL (ref 30–100)

## 2016-11-11 ENCOUNTER — Telehealth: Payer: Self-pay | Admitting: *Deleted

## 2016-11-11 NOTE — Telephone Encounter (Signed)
Pt informed with normal lab results on 11/06/16 vitamin d and calcium.

## 2017-02-25 ENCOUNTER — Encounter: Payer: Self-pay | Admitting: Gynecology

## 2020-12-19 ENCOUNTER — Encounter: Admit: 2020-12-19 | Payer: PRIVATE HEALTH INSURANCE | Primary: Family Medicine

## 2021-07-23 ENCOUNTER — Ambulatory Visit: Admit: 2021-07-23 | Payer: MEDICARE | Primary: Family Medicine

## 2021-07-23 ENCOUNTER — Ambulatory Visit: Admit: 2021-07-23 | Payer: MEDICARE | Attending: Geriatrics | Primary: Family Medicine

## 2021-07-31 ENCOUNTER — Telehealth: Admit: 2021-07-31 | Payer: PRIVATE HEALTH INSURANCE | Attending: Geriatric Medicine | Primary: Family Medicine

## 2021-07-31 NOTE — Telephone Encounter
[facility] MESSAGETime of call:   5:13 PMCaller:   MicheleCaller's relationship to patient:  Daughter  Calling from [employer], hospital, agency, etc.):  n/a   Reason for call:   [name] called to schedule new patient appointment. Scheduled with Dr. Chapman Moss on 09/25/21 at 10:30. Advised to arrive 30 minutes early.If not feeling well, what are symptoms:  n/a   If having symptoms, how long have the symptoms been present:  n/a   Does caller request to speak to someone urgently?  no   If yes, warm transferred to:  n/aBest telephone number for callback:   na Best time to return call:   na Permission to leave message:  no   [facility]

## 2021-08-21 ENCOUNTER — Ambulatory Visit: Admit: 2021-08-21 | Payer: MEDICARE | Attending: Geriatric Medicine | Primary: Family Medicine

## 2021-09-09 ENCOUNTER — Ambulatory Visit: Admit: 2021-09-09 | Payer: MEDICARE | Attending: Geriatric Medicine | Primary: Family Medicine

## 2021-09-09 DIAGNOSIS — R413 Other amnesia: Secondary | ICD-10-CM

## 2021-09-09 DIAGNOSIS — R2681 Unsteadiness on feet: Secondary | ICD-10-CM

## 2021-09-09 DIAGNOSIS — W19XXXD Unspecified fall, subsequent encounter: Secondary | ICD-10-CM

## 2021-09-09 DIAGNOSIS — F03918 Unspecified dementia, unspecified severity, with other behavioral disturbance: Secondary | ICD-10-CM

## 2021-09-09 MED ORDER — TRAZODONE 50 MG TABLET
50 mg | ORAL_TABLET | Freq: Two times a day (BID) | ORAL | 6 refills | Status: AC | PRN
Start: 2021-09-09 — End: 2021-09-11

## 2021-09-09 MED ORDER — ESCITALOPRAM 5 MG TABLET
5 mg | Status: AC
Start: 2021-09-09 — End: 2021-09-09

## 2021-09-09 MED ORDER — ASPIRIN 81 MG CHEWABLE TABLET
81 mg | Freq: Every day | ORAL | Status: AC
Start: 2021-09-09 — End: ?

## 2021-09-09 MED ORDER — TRAZODONE 50 MG TABLET
50 mg | Freq: Two times a day (BID) | ORAL | Status: AC | PRN
Start: 2021-09-09 — End: 2021-09-09

## 2021-09-09 MED ORDER — ATORVASTATIN 10 MG TABLET
10 mg | ORAL | Status: AC
Start: 2021-09-09 — End: ?

## 2021-09-09 MED ORDER — SERTRALINE 25 MG TABLET
25 mg | ORAL_TABLET | Freq: Every day | ORAL | 6 refills | Status: AC
Start: 2021-09-09 — End: 2021-09-11

## 2021-09-09 MED ORDER — MIRTAZAPINE 7.5 MG TABLET
7.5 mg | Status: AC
Start: 2021-09-09 — End: 2021-09-09

## 2021-09-09 MED ORDER — RISPERIDONE 0.25 MG TABLET
0.25 mg | Status: AC
Start: 2021-09-09 — End: 2021-09-09

## 2021-09-09 MED ORDER — CARVEDILOL IMMEDIATE RELEASE 3.125 MG TABLET
3.125 mg | Status: AC
Start: 2021-09-09 — End: ?

## 2021-09-09 NOTE — Other
Case Manager Initial Visit Progress NoteVictoria T Scott is a 87 y.o., Widowed, white female.  She has 13 years of education, is literate at baseline, is [place] speaking, and is retired from working as a Haematologist.  The patient currently lives with her daughter [name] and her granddaughter and her granddaughter's husband.  No one is the patient's designated POA and health care agent.  The patient does not have a living will.  The patient was accompanied by her daughter [name].The patient is being assessed for issues related to memory loss and mood changes.  Family has noticed changes in the patient's cognitive function over the past 5 years, with primary deficits in forgetfulness and anger and frustration.  She does have a psychiatric history of anxiety and depression.  Current mood is described as pleasant.  Current behavior is described as appropriate.  Appetite is good, with weight 117 lbs.  Sleep is fair.Family has noticed a decline in functioning from the patient's best baseline over the past 5.  Deficits in instrumental activities of daily living are all.  In regard to driving the patient no longer drives.  In regard to financial management the patient is dependent.  In regard to medication management the patient is dependent.  In self care, deficits are all.  She requires help with bathing, dressing and toileting.  She can feed herself.  The patient ambulates without an assistive device.  The patient has experienced 1 falls in the past 6 months.  She is continent of urine and is continent of stool and is wearing protective under clothes for occasional urinary leakage.Services in place include:  Around the clock home health aide.  This level of care is adequate to meet the patient's needs.  The patient does not receive [place] funded financial assistance for community based care.  Daughter [name] appears to be concerned by the patient's condition and supportive of her care needs.

## 2021-09-09 NOTE — Progress Notes
History of Problem from Patient/ Geriatric Review of Systems:Victoria Scott is a 87 yo F with pMHx of HTN, HLD, osteoporosis, dementia with behavioral disturbances, anxiety, CAD, presented to the [facility] for a geriatric assessment. Pt is here today with her daughter Victoria Scott. Per Victoria Scott, family noticed memory decline starting in 2017. They started helping with finances in 2018. Pt was evaluated by neurology, daughter reports MRI Brain was normal at time of evaluation. Cognitive testing showed deficits and was started on donepezil.  Pt took it for a short period of time and discontinued it herself because it was too activating. Currently, pt is very unsteady, has had multiple falls. She has privately paid 24/7 HHA since July, after her last fall. However, family is concerned because pt is running out of money to pay for HHA. There has been some conversations between the siblings to buy a house and for one of the siblings to take care of pt. However, there has been come conflicts and a lawyer is involved to help navigate the situation.Regarding medications, pt was on xanax briefly and was discontinued. She was started on mirtazapine 7.5mg  nightly for sleep but doesn't appear to be helping. Lexapro 5mg  was started in August, dose was increased to 10mg  daily then back down to 5mg  daily because it was not helpful. 3 weeks ago, risperidone 0.25mg  bid PRN was started for agitation because pt was combative with her aides. Pt is getting it about once a day. However, it is also not helping and they had to call 911 a few times. Trazodone was prescribed by PCP last week but daughter never picked it up. Pt's currently living with her daughter Victoria Scott and granddaughter. She usually alternates living with her children. Pt reports her granddaughter has issues but does not feel unsafe living with them. Pt shared that she does not want to be alone but usually feels better with her family. There are arguments among family members but pt does not feel threatened or unsafe. Pt states her health has been poor. She  reports multiple falls in the past year and no dizziness on standing.  Her sleep has been on and off with some difficulty falling asleep and early morning awakening. Her appetite has been fair  with no recent weight change in the past 6 months. Her mood has been poor, mostly because she feels that her family treats her like a child. No SI/HI.  She reports some problems with memory that does not  affect  daily function.  She is no longer driving.  She reports no alcohol use and no tobacco use.  She has no family history of memory problems.Review of Systems:General:  negativeEyes:  negativeEars/Nose/Throat:  negativeCardiovascular:  negativeRespiratory:  negativeGastrointestinal:  negativeGenitourinary:  negativeMusculoskeletal:  negativeSkin:  negativeNeurologic:  negativePsychiatric:  anxiety and memory lossEndocrine:  negativeHeme/Lymphatic:  negativeAllergic/Immunologic:  negativeSleep Problems:  onset and maintenanceAppetite Problems:  noneUrinary Incontinence:  noneFalls:  multiple in past yearDizziness:  nonePast Medical History/ Review of Records:  The patient  has no past medical history on file.Medications:  No current outpatient medications on file. No current facility-administered medications for this visit.  Vision Test:     Near vision was tested    The near visual acuity for the right eye was  , left eye was  , and both eyes was  .  Far vision was tested    The far visual acuity for the right eye was  , left eye was  , and both eyes was  .  She is  .  She  .  Hearing Test:The patient  .  The right ear scored  .  The left ear scored  .    Vitals:  Vitals 09/09/2021 Pulse 93 BP 162/97 NIBP MAP (mmHg) 119 Weight 117 lb   Mental Status Examination:  On the Montreal Cognitive Assessment [facility]), the patient scored 12/30.  On clock drawing, the patient had some difficulty with contour, number and hands placement.   On other measures, the patient scored 0/5 on executive function, 1/3 on naming, 3/3 on attention, 1/3 on serial 7s, 2/3 on language, 1/3 on serial 7s, 2/3 on language, 1/2 on abstraction, 0/5 on delayed recall, and 3/6 on orientation indicating some impairment .  Her affect was normal. Impression/Plan:  Victoria Scott is a 87 y.o. year old female with a 3+ year(s) history of memory loss with behavioral disturbances.  On today's evaluation, her memory loss is consistent with dementia, in the mild stage. Potentially treatable contributors, including B1, B12 and  thyroid function, should be checked. Neuroimaging should be obtained. There is some evidence of anxiety and depression.  We would recommend the following:  -check reversible labs: B1, B12, TSH-obtain EKG to monitor QTc since pt is on multiple QTc prolonging agents-obtain volumetric MRI Brain -stop lexapro, risperidone, and mirtazapine since none have been effective-start sertraline 25mg  daily, increase after 4 weeks if needed-ok to use trazodone 25mg  BID PRN for sleep or agitation-continue melatonin nightly for sleep-refer to physical therapy for balance and gait given hx of multiple falls-refer to EPS for complex social situationWe will plan on seeing her back in 6 month(s) and will maintain phone contact in the interim.  A total of 90 minutes was spent evaluating and formulating a care plan for this patient with > 50% of that time in counseling and coordination of care.

## 2021-09-09 NOTE — Patient Instructions
Score on the Montreal Cognitive Assessment [facility]) done today was 12/30.  Normal score is 26 and aboveDiscontinue Lexapro, Mirtazapine and Risperidone.We will start Sertraline 25 mg daily.  (See attached info)We will start Trazodone 25 mg twice a day as needed for sleep and agitation.We will check labs today and we will call [name] if anything is abnormalWe will check an EKG todayWe will order a Volumetric brain MRI.  Please call to schedule. [phone] will make a referral for physical therapy .  They will call [name] to make an appointmentYou can find your follow up appointment in [facility] call the [facility] with any questions or concerns. [phone]

## 2021-09-10 ENCOUNTER — Encounter: Admit: 2021-09-10 | Payer: PRIVATE HEALTH INSURANCE | Attending: Geriatric Medicine | Primary: Family Medicine

## 2021-09-10 DIAGNOSIS — F419 Anxiety disorder, unspecified: Secondary | ICD-10-CM

## 2021-09-10 LAB — VITAMIN B12: BKR VITAMIN B12: 910 pg/mL (ref 232–1245)

## 2021-09-10 LAB — TSH W/REFLEX TO FT4     (BH GH LMW Q YH): BKR THYROID STIMULATING HORMONE: 2.14 ??IU/mL

## 2021-09-10 NOTE — Telephone Encounter
Called in regards to a couple of things.1.) That the Trazadone and Sertraline medication that was newly prescribed  to the patient is not in the pharmacy.2.) When she called to schedule a Volumetric MRI for the patient, was advised by them that the Nurse Case Manager at [facility] would know of specfic places close to the patients area where they can have it done.3.) If the EKGs were sent to the patients cardiologist because when she called them, they told her that they have not received it yet.

## 2021-09-10 NOTE — Telephone Encounter
Returned daughters call.  I explained that the prescriptions were sent yesterday and discovered they wanted a defferent pharmacy then the one on file.  I explained that I faxed the EKG yesterday to the cardiologist and I told her I will fax it again.  I told her I will look for a place that does the Volumetric brain MRI closer to them and I will let her know when I find it.She verbalized understanding

## 2021-09-11 ENCOUNTER — Telehealth: Admit: 2021-09-11 | Payer: PRIVATE HEALTH INSURANCE | Attending: Geriatric Medicine | Primary: Family Medicine

## 2021-09-11 MED ORDER — TRAZODONE 50 MG TABLET
50 mg | ORAL_TABLET | Freq: Two times a day (BID) | ORAL | 6 refills | Status: AC | PRN
Start: 2021-09-11 — End: 2021-09-16

## 2021-09-11 MED ORDER — SERTRALINE 25 MG TABLET
25 mg | ORAL_TABLET | Freq: Every day | ORAL | 6 refills | Status: AC
Start: 2021-09-11 — End: 2021-09-16

## 2021-09-11 NOTE — Telephone Encounter
(  [name]) Called to report that medications were sent to the wrong pharmacy, [facility].  They should have been sent to [facility].

## 2021-09-12 ENCOUNTER — Telehealth: Admit: 2021-09-12 | Payer: PRIVATE HEALTH INSURANCE | Attending: Geriatric Medicine | Primary: Family Medicine

## 2021-09-12 NOTE — Telephone Encounter
[name] said that  Ricci's Lexapro, Mirtazapine and Risperidone were discontinued on Monday evening  She reported that her mother hasn't slept in 2 nights, is crying, and is more confused and disoriented thinking she was in [place]. [name]  picked up the Trazodone and Sertraline yesterday evening and will give them to her today. I advised her to call tomorrow and let us know how she is doing.

## 2021-09-12 NOTE — Telephone Encounter
Called in regards to the patients severe cognitive changes. States that they were unable to start the new medication that was prescribed due to it being sent to the incorrect pharmacy. However she said that due to the patients previous medication being discontinued, the patient was severely psychiatrically impaired, not recognizing where she was, trying to elope out of the building. Wants to speak about possibly putting the patient back on the medication until she calms down.

## 2021-09-12 NOTE — Telephone Encounter
Hi, Alicia from [facility] called to review the dx for [name] and her meds.  Please call her. Thanks, Paul Dykes

## 2021-09-13 ENCOUNTER — Encounter: Admit: 2021-09-13 | Payer: PRIVATE HEALTH INSURANCE | Primary: Family Medicine

## 2021-09-13 ENCOUNTER — Telehealth: Admit: 2021-09-13 | Payer: PRIVATE HEALTH INSURANCE | Attending: Geriatric Medicine | Primary: Family Medicine

## 2021-09-13 MED ORDER — RISPERIDONE 0.25 MG TABLET
0.25 mg | ORAL_TABLET | Freq: Two times a day (BID) | ORAL | 1 refills | Status: AC
Start: 2021-09-13 — End: ?

## 2021-09-13 MED ORDER — MIRTAZAPINE 7.5 MG TABLET
7.5 mg | ORAL_TABLET | Freq: Every evening | ORAL | 1 refills | Status: AC
Start: 2021-09-13 — End: 2021-10-15

## 2021-09-13 NOTE — Telephone Encounter
Returned Michele's call.  It went to VM so I LM explaining the EKG and blood work was all ok and that we are still waiting on the Thiamine level and that is why we hadn't called yet. I explained that the MRI was not emergent and that it will take [place] some time to research out a facility closer to them that does the special MRI.  Told her to call us if she had any further questions

## 2021-09-13 NOTE — Telephone Encounter
Returned Victoria Scott's call.  Answered the questions she had about the medication changes.  She verbalized understanding and has opened the case and will be starting services

## 2021-09-13 NOTE — Telephone Encounter
After speaking with Dr. Wynelle Link and explaining that since the med change that the patient is more behavioral and worse mood, Dr. Wynelle Link recommends stopping the Trazodone and Sertraline that we started and go back on the Mirtazapine 7.5 mg and the Risperidone 0.25 mg BID PRN.  I told her to call us on Monday with an update. She verbalized understanding

## 2021-09-13 NOTE — Telephone Encounter
Called to say that no one has gotten back to her with the EKG and Bloodwork results for the patient. Also informed that no one reached out to her with suggestions on where she can get the Volumetric MRI done that is closer to their residence.

## 2021-09-14 ENCOUNTER — Telehealth: Admit: 2021-09-14 | Payer: PRIVATE HEALTH INSURANCE | Attending: Internal Medicine | Primary: Family Medicine

## 2021-09-14 NOTE — Telephone Encounter
Received call from patient's daughter, Victoria Scott, who is not usually managing the patient's medications but is taking care of the patient over the weekend.  She is unable to care for her mother currently due to combative behavior from the patient, (including hitting, scratching, yelling, swearing).  She would like to know what other medications she could give her to control the behavior or whether she needs to bring her mother to the emergency room.  There have been many medication changes recently, which have been controlled by the patient's other daughter, Victoria Scott. Victoria Scott says there has not been a stable medication regimen due to Victoria Scott frequently changing doses, but knows that sertraline, trazodone and lexapro were recently stopped.  Patient was advised yesterday to return to a previous regimen of mirtazapine 7.5 mg nightly and risperidone 0.25 mg twice a day.  Last night the patient received the new regimen (mirtazapine 7.5 mg and risperidone 0.25 mg) and she already received 0.25 mg of risperidone this morning.  Suspect that her current behavior has been exacerbated by all of the recent medication changes.I advised Victoria Scott to give an extra dose of risperidone 0.25 mg now and will check in in a couple hours to see how the patient's behavior is.

## 2021-09-15 LAB — VITAMIN B1, WHOLE BLOOD: VITAMIN B1, WHOLE BLOOD: 143 nmol/L (ref 78–185)

## 2021-09-15 NOTE — Telephone Encounter
Spoke with [place] today. Patient got only one dose of risperidone 0.25 mg during the day yesterday and did not need a 2nd dose. She got the mirtazapine 7.5mg  before bed. The agitated behavior yesterday may have been situational/triggered by unfamiliar caregivers. Marcelino Duster and I settled on the following plan for medications:- risperidone 0.25 mg every morning- mirtazapine 7.5mg  nightly- patient can get an additional dose of risperidone 0.25mg  as needed during the day for agitation.

## 2021-09-16 ENCOUNTER — Encounter: Admit: 2021-09-16 | Payer: PRIVATE HEALTH INSURANCE | Primary: Family Medicine

## 2021-09-16 NOTE — Telephone Encounter
(  [name]) Victoria Scott is following up on call with Dr. Bennett Scrape this past weekend. She has a few questions.

## 2021-09-16 NOTE — Telephone Encounter
Returned Michele's call.  We reviewed the medication management and clarified as she requested.    I asked her to call if patients behaviors become problematic.She verbalized understanding.

## 2021-09-20 ENCOUNTER — Telehealth: Admit: 2021-09-20 | Payer: PRIVATE HEALTH INSURANCE | Attending: Geriatric Medicine | Primary: Family Medicine

## 2021-09-20 DIAGNOSIS — R413 Other amnesia: Secondary | ICD-10-CM

## 2021-09-20 NOTE — Telephone Encounter
Victoria Scott wanted to know if we found a place near her to get Victoria Scott's MRI. She reported that she hasn't heard form [facility] yet and her  mother's lawyers want remove  her niece and niece's husband from the house. Her niece took her cat out of the house to be spiteful  and [name] is worried about the cat. I told her [facility]  would get in touch as soon as they can. I told her there was a place in [place] that does volumetric MRIs. I  gave her the name and number of [facility] and told her I would fax order over. She appreciated it and will call to schedule.

## 2021-09-20 NOTE — Telephone Encounter
Called to inform that no one from [employer] reached out to her yet and that she has more questions regarding the patients medications.

## 2021-09-25 ENCOUNTER — Ambulatory Visit: Admit: 2021-09-25 | Payer: MEDICARE | Attending: Geriatric Medicine | Primary: Family Medicine

## 2021-09-25 NOTE — Telephone Encounter
Marcelino Duster reported that [name] has her brain MRI scheduled for Friday at [facility]. She said Ninfa Linden keeps calling her to schedule MRI. I told her to let them know [name] was having it done at another facility. She said visiting nurse checked her blood pressure and it was elevated and she thinks it's because of her stress. She wants her to see geriatric psych and I told her I would mail her a list.

## 2021-09-25 NOTE — Telephone Encounter
(  [name]) Victoria Scott called regarding MRI to be scheduled

## 2021-09-26 ENCOUNTER — Telehealth: Admit: 2021-09-26 | Payer: PRIVATE HEALTH INSURANCE | Attending: Geriatric Medicine | Primary: Family Medicine

## 2021-09-26 NOTE — Telephone Encounter
Says that after speaking to [facility], she added the Respirdone per her suggestion which made the patient even more irratable, angry and will not leave the house for appointments. She says that she wants to speak to Dr. Wynelle Link and does not know what to do.

## 2021-09-26 NOTE — Telephone Encounter
Returned daughters call.  She reports that the Risperidone 0.25 mg, mirtazapine and melatonin did not help with behaviors and sleep last night.  She has not been giving the Risperidone regularly twice a day only once a day in the morning.  She reports that  for 10 days after restarting the Risperidone and Mirtazapine she was at her baseline until Tuesday night when she didn't sleep and behaviors worsened .  I told her she needs to bring her to her PCP and have labs and urine check to make sure there is nothing medical triggering this change. She also reports that there is a lot of stress going on in the house with court and lawyers and bad relationship with her granddaughter whom she lives with.  I explained that she needs to try and not talk about stressful things around her and try and keep her distracted with happy things and thoughts because the stress could also be triggering these changes.  I told her I will speak with Dr. Wynelle Link and if she had any other thoughts I would call her back. She verbalized understanding

## 2021-09-30 ENCOUNTER — Telehealth: Admit: 2021-09-30 | Payer: PRIVATE HEALTH INSURANCE | Attending: Geriatric Medicine | Primary: Family Medicine

## 2021-09-30 NOTE — Telephone Encounter
After speaking with Dr. Wynelle Link I returned daughter Victoria Scott's call.  Gave her the results of the MRI as per Dr. Wynelle Link (+neurodegenerative process).  We said we would discuss adding a memory medication once we get her mood and behaviors under control.  We will continue with the Risperidone 0.25mg  twice a day and we discussed they can give the 2nd dose in the late afternoon or early evening they don't have to wait until bedtime. Continue the Mirtazapine and Melatonin.  She verbalized understanding

## 2021-09-30 NOTE — Telephone Encounter
Called to inquire if the results of the pateints MRI have been sent over. Also wanted to update on how the patients PCP will not order bloodwork or have the patient be tested for a UTI. Says that when they called Dr. Rosine Beat office, he refused to have orders be sent in for the patient have bloodwork or UTI testing be done; did not think it was necessary but when the patients visiting nurse came, she advised them that the patient might have a UTI developing. Wants to know if Dr. Wynelle Link can call to have the Doctor reconsider.

## 2021-10-03 ENCOUNTER — Telehealth: Admit: 2021-10-03 | Payer: PRIVATE HEALTH INSURANCE | Attending: Geriatric Medicine | Primary: Family Medicine

## 2021-10-03 NOTE — Telephone Encounter
Called to update on how the patient is doing and that she 'had a rough night' yesterday. She said that she wanted to talk to Dr. Wynelle Link directly. I explained to her that the Providers are not here as often as the nurses so they communicate the information to the provider and then give a response but she kept on insisting that she wanted to speak to Dr. Wynelle Link.

## 2021-10-03 NOTE — Telephone Encounter
Spoke to daughter [name] on the phone.She is concerned about her mother's cognitive decline and recognizes that it is the family environment that makes her mother agitatedShe is currently on risperidone 0.25mg  BID, mirtazapine 7.5mg  nightly and trazodone 25mg  BID PRNMichele reports pt responded well to trazodone when she gave it to herWe agreed to keep the current regimen for now. [name] will keep track of the frequency of trazodone usedOnce pt is more stable on this regimen, we will consider tapering pt off risperidoneMichele agreed with this plan and was appreciative of the call.Viviano Simas, MDGeriatrics attending

## 2021-10-10 ENCOUNTER — Telehealth: Admit: 2021-10-10 | Payer: PRIVATE HEALTH INSURANCE | Primary: Family Medicine

## 2021-10-10 NOTE — Telephone Encounter
Marcelino Duster called previously and spoke with Dr. Wynelle Link. She called to provide you with an update on how pt is doing

## 2021-10-10 NOTE — Telephone Encounter
After speaking with Dr. Wynelle Link I called Marcelino Duster back it went to VM so I LM telling her that Dr. Wynelle Link wants to keep things as is right now the Risperadone 0.25 mg in the AM and to try and get her to take the Trazodone twice a day for agitation, as she had reported the one time she got her to take it it helped calm her.  I told her if she refuses you can tell her its a Tylenol or a vitamin.  I told her to call us back after she has gotten her to take the Trazodone regularly for a week or so and let us know how it is working.

## 2021-10-10 NOTE — Telephone Encounter
Victoria Scott, daughter called back.  She reports that she has stopped giving the Risperidone and bedtime as Dr. Wynelle Link recommended last conversation, she did this slowly over a week giving it every other day and she had her last dose 2 days ago.  Victoria Scott sees no change.  I told her I would update Dr. Wynelle Link and let her know what next steps are.  She verbalized understanding

## 2021-10-10 NOTE — Telephone Encounter
Returned daughter Michelle's call.  It went to VM left message asking her to call us back

## 2021-10-11 ENCOUNTER — Telehealth: Admit: 2021-10-11 | Payer: PRIVATE HEALTH INSURANCE | Primary: Family Medicine

## 2021-10-11 NOTE — Telephone Encounter
Victoria Scott reported that Victoria Scott refuses to take trazodone. She has been staying at [facility] home for the past week and will go back home on Monday.  She had an aide until 9 pm last night. She woke up at 2:30 am and had to go to the bathroom, asked for something to eat and drink. She took the commode and banged it up and down. She wouldn't take a trazodone then. I advised her to give her the trazodone before bed with her other bedtime medications and let us know next week how she is doing. She reported her PCP ordered nitrofurantoin for a possible UTI.

## 2021-10-11 NOTE — Telephone Encounter
Victoria Scott was returning Claudines call

## 2021-10-12 ENCOUNTER — Encounter: Admit: 2021-10-12 | Payer: PRIVATE HEALTH INSURANCE | Attending: Geriatric Medicine | Primary: Family Medicine

## 2021-10-15 MED ORDER — MIRTAZAPINE 7.5 MG TABLET
7.5 mg | ORAL_TABLET | Freq: Every evening | ORAL | 3 refills | Status: AC
Start: 2021-10-15 — End: ?

## 2021-10-16 NOTE — Telephone Encounter
Returned daughter Michelle's call.  It went to VM so I LM asking her to call us back.

## 2021-10-16 NOTE — Telephone Encounter
Victoria Scott called to report that pt is awaking at night and ready to take the world on. She had a bad night last night and called the police to her home. She questioned whether or not the medication needs to be adjusted.

## 2021-10-16 NOTE — Telephone Encounter
Hi, Can you please call back daughter as soon as possible to discuss [name].  She does not know what to do to handle her mother.  Her behavior is out of control.  She wants to discuss upping Trazadone. Thanks, Paul Dykes

## 2021-10-17 NOTE — Telephone Encounter
Hi, Marcelino Duster called and returned your call.  Please call her. Thanks, Paul Dykes

## 2021-10-18 NOTE — Telephone Encounter
[name] called again to discuss medication. She asked that [facility] or Dr. Wynelle Link call her back before the end of the day.

## 2021-10-18 NOTE — Telephone Encounter
[name] reported that Victoria Scott is going to be alone since her niece is moving out. She has an aide around the clock.  She was not sleeping. Victoria Scott called Dr Welton Flakes and he said to increase trazodone to 50 mg at bedtime and it worked and she slept for the past 2 nights. She also takes mirtazapine 7.5 mg at bedtime. She is not taking melatonin any more.

## 2021-10-25 ENCOUNTER — Telehealth: Admit: 2021-10-25 | Payer: PRIVATE HEALTH INSURANCE | Primary: Family Medicine

## 2021-10-25 NOTE — Telephone Encounter
Daughter called to report increased confusion since she has acquired a head cold.  She has seen her PCP and is being treated with abx and Mucinex.  I explained that this is to be expected and that once she starts feeling better she will see an improvement in her mental status.  She verbalized understanding

## 2021-10-29 ENCOUNTER — Telehealth: Admit: 2021-10-29 | Payer: PRIVATE HEALTH INSURANCE | Attending: Geriatric Medicine | Primary: Family Medicine

## 2021-10-29 NOTE — Telephone Encounter
[name] reported that [name] had an upper respiratory infection. She is weak and her legs are wobbly sometimes. Her PCP made a referral  for physical therapy. She is on an antibiotic and Mucinex.  She has a nurse coming twice a week to check vitals.

## 2021-10-29 NOTE — Telephone Encounter
Hi, Marcelino Duster called to discuss Victoria Scott's condition.  She needs some advice on how to handle her.Thanks, Paul Dykes

## 2021-11-04 ENCOUNTER — Telehealth: Admit: 2021-11-04 | Payer: PRIVATE HEALTH INSURANCE | Primary: Family Medicine

## 2021-11-04 NOTE — Telephone Encounter
Wants to talk about med problem and extreme decline in patient.

## 2021-11-04 NOTE — Telephone Encounter
Returned daughter, Michele's call it went to VM so I LM asking her to call us back

## 2021-11-11 ENCOUNTER — Telehealth: Admit: 2021-11-11 | Payer: PRIVATE HEALTH INSURANCE | Attending: Geriatric Medicine | Primary: Family Medicine

## 2021-11-11 ENCOUNTER — Encounter: Admit: 2021-11-11 | Payer: PRIVATE HEALTH INSURANCE | Primary: Family Medicine

## 2021-11-11 NOTE — Telephone Encounter
She would like to know if a recommendation can made for a psychiatrist since pt is having difficulty dealing with her health issues.

## 2021-11-12 ENCOUNTER — Encounter: Admit: 2021-11-12 | Payer: PRIVATE HEALTH INSURANCE | Primary: Family Medicine

## 2021-11-12 NOTE — Telephone Encounter
Returned daughter Merla Riches call made her aware that I sent out a list of psychiatrists as she requested.  She verbalized understanding

## 2021-11-29 ENCOUNTER — Telehealth: Admit: 2021-11-29 | Payer: PRIVATE HEALTH INSURANCE | Attending: Geriatric Medicine | Primary: Family Medicine

## 2021-11-29 MED ORDER — QUETIAPINE IMMEDIATE RELEASE 25 MG TABLET
25 mg | ORAL_TABLET | Freq: Every evening | ORAL | 4 refills | Status: AC
Start: 2021-11-29 — End: ?

## 2021-11-29 NOTE — Telephone Encounter
Spoke with [name]. Patient is more confused and is not sleeping and her mind is going like a ferris wheel. She is acting like she's on speed or has ADD. She is up all night, talking to her parents, repeating herself. She is taking risperidone 0.25mg  once in the morning, which is not helpful, and mirtazapine 7.5mg  nightly. She is complaining of pain of unclear etiology. She got ativan from a primary care provider/APRN. [name] thinks this calmed her down a little - gives her 1-2 hrs of sleep. APRN has mentioned hospice. [name] is not sure if she would quality at this time. Trazodone was tried in the past and didn't help her sleep or agitation. I told [name] that it sounds like delirium to me, perhaps due to recent viral illness and complicated by use of ativan. Would focus on helping sleep. Plan: - stop ativan- We discussed stopping risperidone, but [name] was afraid she would have a bad reaction if stopping this aburtly, so ok to continue for now. She is only taking this in the morning. - start seroquel 25 mg nightly. Can repeat after 1 hour if still not sleeping. - give tylenol 650 mg q 6 hours [name] will call early next week to let us know how the patient is doing.

## 2021-11-29 NOTE — Telephone Encounter
(  [name]) Victoria Scott called to report changes. Pt is not sleeping, repeating the same thing over and over. Almost like she is on speed. Victoria Scott does not wish to speak with case manager. She requested to speak with Dr. Wynelle Link or Dr. Bennett Scrape.

## 2022-02-10 DEATH — deceased

## 2022-02-22 ENCOUNTER — Encounter: Admit: 2022-02-22 | Payer: PRIVATE HEALTH INSURANCE | Attending: Internal Medicine | Primary: Family Medicine

## 2022-05-05 ENCOUNTER — Ambulatory Visit: Admit: 2022-05-05 | Payer: MEDICARE | Primary: Family Medicine
